# Patient Record
Sex: Female | Born: 1950
Health system: Southern US, Community
[De-identification: ages and names within clinical notes are randomized; demographics above are authoritative.]

## PROBLEM LIST (undated history)

## (undated) DIAGNOSIS — T7840XA Allergy, unspecified, initial encounter: Secondary | ICD-10-CM

## (undated) DIAGNOSIS — Z860100 Personal history of colon polyps, unspecified: Secondary | ICD-10-CM

## (undated) DIAGNOSIS — Z8619 Personal history of other infectious and parasitic diseases: Secondary | ICD-10-CM

## (undated) DIAGNOSIS — K219 Gastro-esophageal reflux disease without esophagitis: Secondary | ICD-10-CM

## (undated) DIAGNOSIS — I10 Essential (primary) hypertension: Secondary | ICD-10-CM

## (undated) DIAGNOSIS — R011 Cardiac murmur, unspecified: Secondary | ICD-10-CM

## (undated) HISTORY — PX: TUBAL LIGATION: SHX77

## (undated) HISTORY — DX: Personal history of other infectious and parasitic diseases: Z86.19

## (undated) HISTORY — DX: Allergy, unspecified, initial encounter: T78.40XA

## (undated) HISTORY — DX: Personal history of colon polyps, unspecified: Z86.0100

## (undated) HISTORY — DX: Personal history of colonic polyps: Z86.010

## (undated) HISTORY — DX: Gastro-esophageal reflux disease without esophagitis: K21.9

## (undated) HISTORY — DX: Cardiac murmur, unspecified: R01.1

## (undated) HISTORY — PX: BREAST BIOPSY: SHX20

---

## 2000-09-16 ENCOUNTER — Encounter: Admission: RE | Admit: 2000-09-16 | Discharge: 2000-09-16 | Payer: Self-pay | Admitting: Obstetrics and Gynecology

## 2000-09-16 ENCOUNTER — Other Ambulatory Visit: Admission: RE | Admit: 2000-09-16 | Discharge: 2000-09-16 | Payer: Self-pay | Admitting: Obstetrics and Gynecology

## 2000-09-16 ENCOUNTER — Encounter: Payer: Self-pay | Admitting: Obstetrics and Gynecology

## 2000-10-06 ENCOUNTER — Encounter: Admission: RE | Admit: 2000-10-06 | Discharge: 2000-10-06 | Payer: Self-pay | Admitting: Obstetrics and Gynecology

## 2000-10-06 ENCOUNTER — Encounter: Payer: Self-pay | Admitting: Obstetrics and Gynecology

## 2002-01-05 ENCOUNTER — Encounter: Admission: RE | Admit: 2002-01-05 | Discharge: 2002-01-05 | Payer: Self-pay | Admitting: Obstetrics and Gynecology

## 2002-01-05 ENCOUNTER — Encounter: Payer: Self-pay | Admitting: Obstetrics and Gynecology

## 2002-01-05 ENCOUNTER — Other Ambulatory Visit: Admission: RE | Admit: 2002-01-05 | Discharge: 2002-01-05 | Payer: Self-pay | Admitting: Obstetrics and Gynecology

## 2002-09-30 HISTORY — PX: BREAST BIOPSY: SHX20

## 2002-11-11 ENCOUNTER — Encounter: Admission: RE | Admit: 2002-11-11 | Discharge: 2002-11-11 | Payer: Self-pay | Admitting: *Deleted

## 2002-11-11 ENCOUNTER — Encounter: Payer: Self-pay | Admitting: *Deleted

## 2002-11-16 ENCOUNTER — Encounter: Admission: RE | Admit: 2002-11-16 | Discharge: 2002-11-16 | Payer: Self-pay | Admitting: *Deleted

## 2002-11-16 ENCOUNTER — Encounter: Payer: Self-pay | Admitting: *Deleted

## 2002-11-22 ENCOUNTER — Encounter: Admission: RE | Admit: 2002-11-22 | Discharge: 2002-11-22 | Payer: Self-pay | Admitting: *Deleted

## 2002-11-22 ENCOUNTER — Encounter: Payer: Self-pay | Admitting: *Deleted

## 2002-12-10 ENCOUNTER — Encounter (INDEPENDENT_AMBULATORY_CARE_PROVIDER_SITE_OTHER): Payer: Self-pay | Admitting: *Deleted

## 2002-12-10 ENCOUNTER — Ambulatory Visit (HOSPITAL_BASED_OUTPATIENT_CLINIC_OR_DEPARTMENT_OTHER): Admission: RE | Admit: 2002-12-10 | Discharge: 2002-12-10 | Payer: Self-pay | Admitting: *Deleted

## 2004-02-20 ENCOUNTER — Encounter: Admission: RE | Admit: 2004-02-20 | Discharge: 2004-02-20 | Payer: Self-pay | Admitting: Obstetrics and Gynecology

## 2005-03-25 ENCOUNTER — Encounter: Admission: RE | Admit: 2005-03-25 | Discharge: 2005-03-25 | Payer: Self-pay | Admitting: Obstetrics and Gynecology

## 2006-05-13 ENCOUNTER — Encounter: Admission: RE | Admit: 2006-05-13 | Discharge: 2006-05-13 | Payer: Self-pay | Admitting: Obstetrics and Gynecology

## 2006-05-23 ENCOUNTER — Encounter: Admission: RE | Admit: 2006-05-23 | Discharge: 2006-05-23 | Payer: Self-pay | Admitting: Obstetrics and Gynecology

## 2007-08-14 ENCOUNTER — Encounter: Admission: RE | Admit: 2007-08-14 | Discharge: 2007-08-14 | Payer: Self-pay | Admitting: Obstetrics and Gynecology

## 2009-10-02 ENCOUNTER — Encounter: Admission: RE | Admit: 2009-10-02 | Discharge: 2009-10-02 | Payer: Self-pay | Admitting: Obstetrics and Gynecology

## 2010-10-21 ENCOUNTER — Encounter: Payer: Self-pay | Admitting: Obstetrics and Gynecology

## 2011-02-15 NOTE — Op Note (Signed)
NAME:  Shawna Mata, Shawna Mata                          ACCOUNT NO.:  [id number]   MEDICAL RECORD NO.:  [id number]                   PATIENT TYPE:  AMB   LOCATION:  DSC                                  FACILITY:  [facility]   PHYSICIAN:  Maisie Fus B. Samuella Cota, M.D.               DATE OF BIRTH:  August 11, 1951   DATE OF PROCEDURE:  12/10/2002  DATE OF DISCHARGE:                                 OPERATIVE REPORT   CCS#:  [id number]   PREOPERATIVE DIAGNOSES:  Nipple discharge, left breast with abnormal  ductogram.   POSTOPERATIVE DIAGNOSES:  Nipple discharge, left breast with abnormal  ductogram.   OPERATION:  Excision of ductal tissue, left breast.   SURGEON:  Maisie Fus B. Samuella Cota, M.D.   ANESTHESIA:  Local, 1% Xylocaine with anesthesia monitoring, Dr. Katrinka Blazing and  CRNA.   DESCRIPTION OF PROCEDURE:  The patient was taken to the operating room,  placed on the table in supine position. The left breast was prepped and  draped in a sterile field. A circumareolar incision was outlined from about  10 o'clock to 2 o'clock. Local anesthetic was injected in the subcutaneous  tissue and into the breast tissue. The circumareolar incision was then made  and the tissue beneath the upper portion of the areolar was taken down. The  patient had some firmness at the 12 o'clock position. A large duct was seen  and a lacrimal probe placed in it and it went about 2 cm deep. There was  another large duct which was not quite that long. This core of breast tissue  was taken out going essentially from the nipple and areolar complex toward  the chest. No brownish discharge was noted but the drainage from the duct  was watery. The large duct opening was marked with a suture and the depth of  the wound was also marked with a long and short suture. The bleeding was  controlled with the cautery, subcutaneous tissue closed with interrupted  sutures of 3-0 Vicryl and the skin was closed with a running subcuticular  suture of 4-0  Monocryl.  Benzoin and 1/2 inch Steri-Strips were used to reinforce the skin  closure. A pressure dressing using 4 x 4s, ABD and 4 inch Hypofix was  applied. The patient seemed to tolerate the procedure well and was taken to  the PACU in satisfactory condition.                                               Thomas B. Samuella Cota, M.D.    TBP/MEDQ  D:  12/10/2002  T:  12/11/2002  Job:  [id number]   cc:   Malachi Pro. Ambrose Mantle, M.D.  [address]  [place]  [place] [address]  Fax: [phone]   Lenon Curt. Chilton Si, M.D.  [address]  [place]  [place] [address]  Fax: [phone]

## 2011-03-25 ENCOUNTER — Other Ambulatory Visit: Payer: Self-pay | Admitting: Obstetrics and Gynecology

## 2011-03-25 DIAGNOSIS — Z1231 Encounter for screening mammogram for malignant neoplasm of breast: Secondary | ICD-10-CM

## 2011-04-05 ENCOUNTER — Ambulatory Visit: Payer: Self-pay

## 2011-04-08 ENCOUNTER — Ambulatory Visit
Admission: RE | Admit: 2011-04-08 | Discharge: 2011-04-08 | Disposition: A | Discharge status: Home / Self Care | Payer: Managed Care, Other (non HMO) | Source: Ambulatory Visit | Attending: Obstetrics and Gynecology | Admitting: Obstetrics and Gynecology

## 2011-04-08 DIAGNOSIS — Z1231 Encounter for screening mammogram for malignant neoplasm of breast: Secondary | ICD-10-CM

## 2012-04-10 ENCOUNTER — Other Ambulatory Visit: Payer: Self-pay | Admitting: Obstetrics and Gynecology

## 2012-04-10 DIAGNOSIS — Z1231 Encounter for screening mammogram for malignant neoplasm of breast: Secondary | ICD-10-CM

## 2012-04-21 ENCOUNTER — Ambulatory Visit
Admission: RE | Admit: 2012-04-21 | Discharge: 2012-04-21 | Disposition: A | Discharge status: Home / Self Care | Payer: Managed Care, Other (non HMO) | Source: Ambulatory Visit | Attending: Obstetrics and Gynecology | Admitting: Obstetrics and Gynecology

## 2012-04-21 DIAGNOSIS — Z1231 Encounter for screening mammogram for malignant neoplasm of breast: Secondary | ICD-10-CM

## 2014-11-16 ENCOUNTER — Other Ambulatory Visit: Payer: Self-pay

## 2014-11-16 DIAGNOSIS — Z1231 Encounter for screening mammogram for malignant neoplasm of breast: Secondary | ICD-10-CM

## 2014-11-28 ENCOUNTER — Ambulatory Visit
Admission: RE | Admit: 2014-11-28 | Discharge: 2014-11-28 | Disposition: A | Discharge status: Home / Self Care | Payer: PRIVATE HEALTH INSURANCE | Source: Ambulatory Visit

## 2014-11-28 DIAGNOSIS — Z1231 Encounter for screening mammogram for malignant neoplasm of breast: Secondary | ICD-10-CM

## 2015-12-28 ENCOUNTER — Other Ambulatory Visit: Payer: Self-pay

## 2015-12-28 DIAGNOSIS — Z1231 Encounter for screening mammogram for malignant neoplasm of breast: Secondary | ICD-10-CM

## 2016-01-02 ENCOUNTER — Ambulatory Visit
Admission: RE | Admit: 2016-01-02 | Discharge: 2016-01-02 | Disposition: A | Discharge status: Home / Self Care | Payer: PRIVATE HEALTH INSURANCE | Source: Ambulatory Visit

## 2016-01-02 DIAGNOSIS — Z1231 Encounter for screening mammogram for malignant neoplasm of breast: Secondary | ICD-10-CM

## 2016-09-30 HISTORY — PX: ROTATOR CUFF REPAIR: SHX139

## 2017-01-03 ENCOUNTER — Other Ambulatory Visit: Payer: Self-pay | Admitting: Obstetrics and Gynecology

## 2017-01-03 DIAGNOSIS — Z1231 Encounter for screening mammogram for malignant neoplasm of breast: Secondary | ICD-10-CM

## 2017-01-22 ENCOUNTER — Ambulatory Visit
Admission: RE | Admit: 2017-01-22 | Discharge: 2017-01-22 | Disposition: A | Discharge status: Home / Self Care | Payer: PRIVATE HEALTH INSURANCE | Source: Ambulatory Visit | Attending: Obstetrics and Gynecology | Admitting: Obstetrics and Gynecology

## 2017-01-22 DIAGNOSIS — Z1231 Encounter for screening mammogram for malignant neoplasm of breast: Secondary | ICD-10-CM

## 2017-04-22 ENCOUNTER — Encounter (HOSPITAL_COMMUNITY): Payer: Self-pay | Admitting: Nurse Practitioner

## 2017-04-22 ENCOUNTER — Emergency Department (HOSPITAL_COMMUNITY): Payer: PPO

## 2017-04-22 DIAGNOSIS — M79601 Pain in right arm: Secondary | ICD-10-CM | POA: Diagnosis not present

## 2017-04-22 DIAGNOSIS — W19XXXA Unspecified fall, initial encounter: Secondary | ICD-10-CM | POA: Diagnosis not present

## 2017-04-22 DIAGNOSIS — M25511 Pain in right shoulder: Secondary | ICD-10-CM | POA: Diagnosis not present

## 2017-04-22 DIAGNOSIS — I10 Essential (primary) hypertension: Secondary | ICD-10-CM | POA: Insufficient documentation

## 2017-04-22 DIAGNOSIS — M79621 Pain in right upper arm: Secondary | ICD-10-CM | POA: Diagnosis not present

## 2017-04-22 MED ORDER — IBUPROFEN 400 MG PO TABS
400.0000 mg | ORAL_TABLET | Freq: Once | ORAL | Status: AC | PRN
  Administered 2017-04-22: 400 mg via ORAL

## 2017-04-22 MED ORDER — IBUPROFEN 400 MG PO TABS
ORAL_TABLET | ORAL | Status: AC
  Filled 2017-04-22: qty 1

## 2017-04-22 NOTE — ED Triage Notes (Signed)
Pt states while walking her shoe got caught and tripped and fell on right arm. Pt denies LOC. Endorses right elbow and arm pain. No abrasion, bruising, redness or obvious deformities noted. Pt sts unable to lift arm due to pain.

## 2017-04-23 ENCOUNTER — Emergency Department (HOSPITAL_COMMUNITY)
Admission: EM | Admit: 2017-04-23 | Discharge: 2017-04-23 | Disposition: A | Discharge status: Home / Self Care | Payer: PPO | Attending: Emergency Medicine | Admitting: Emergency Medicine

## 2017-04-23 DIAGNOSIS — M79601 Pain in right arm: Secondary | ICD-10-CM

## 2017-04-23 DIAGNOSIS — W19XXXA Unspecified fall, initial encounter: Secondary | ICD-10-CM

## 2017-04-23 HISTORY — DX: Essential (primary) hypertension: I10

## 2017-04-23 MED ORDER — HYDROCODONE-ACETAMINOPHEN 5-325 MG PO TABS
1.0000 | ORAL_TABLET | Freq: Once | ORAL | Status: AC
  Administered 2017-04-23: 1 via ORAL
  Filled 2017-04-23: qty 1

## 2017-04-23 MED ORDER — IBUPROFEN 600 MG PO TABS: 600.0000 mg | ORAL_TABLET | Freq: Four times a day (QID) | ORAL | 0 refills | Status: DC | PRN

## 2017-04-23 MED ORDER — HYDROCODONE-ACETAMINOPHEN 5-325 MG PO TABS: 1.0000 | ORAL_TABLET | ORAL | 0 refills | Status: DC | PRN

## 2017-04-23 NOTE — ED Provider Notes (Signed)
[facility] DEPT Provider Note   CSN: [id number] Arrival date & time: 04/22/17  2216     History   Chief Complaint No chief complaint on file.   HPI Shawna Mata is a 66 y.o. female.  Patient presents after a mechanical fall onto her right arm earlier this evening. She did not hit her head. She has been ambulatory since the fall without complaint of LE pain. No numbness of the RUE, but she reports increased pain when trying to abduct the arm. No chest/abdominal/neck pain.   The history is provided by the spouse and the patient.    Past Medical History:  Diagnosis Date  . Hypertension     There are no active problems to display for this patient.   Past Surgical History:  Procedure Laterality Date  . BREAST BIOPSY      OB History    No data available       Home Medications    Prior to Admission medications   Not on File    Family History No family history on file.  Social History Social History  Substance Use Topics  . Smoking status: Never Smoker  . Smokeless tobacco: Never Used  . Alcohol use Not on file     Allergies   Codeine   Review of Systems Review of Systems  Constitutional: Negative for chills and fever.  Respiratory: Negative.  Negative for shortness of breath.   Cardiovascular: Negative.  Negative for chest pain.  Gastrointestinal: Negative.  Negative for abdominal pain.  Musculoskeletal:       See HPI.  Skin: Negative.  Negative for wound.  Neurological: Negative.  Negative for numbness.     Physical Exam Updated Vital Signs BP 119/76 (BP Location: Left Arm)   Pulse 93   Temp 98.2 F (36.8 C) (Oral)   Resp 18   Ht 5\' 4"  (1.626 m)   Wt 90.7 kg (200 lb)   SpO2 96%   BMI 34.33 kg/m   Physical Exam  Constitutional: She is oriented to person, place, and time. She appears well-developed and well-nourished.  Neck: Normal range of motion.  Cardiovascular: Intact distal pulses.   Pulmonary/Chest: Effort normal. She  exhibits no tenderness.  Abdominal: There is no tenderness.  Musculoskeletal:  No midline cervical tenderness. RUE is unremarkable in appearance - no swelling, discoloration or deformity. Tender over lateral proximal upper arm. Pain with active and passive ROM.  Neurological: She is alert and oriented to person, place, and time.  Skin: Skin is warm and dry.     ED Treatments / Results  Labs (all labs ordered are listed, but only abnormal results are displayed) Labs Reviewed - No data to display  EKG  EKG Interpretation None       Radiology Dg Humerus Right  Result Date: 04/22/2017 CLINICAL DATA:  Persistent pain after falling onto right arm today. EXAM: RIGHT HUMERUS - 2+ VIEW COMPARISON:  None. FINDINGS: There is no evidence of fracture or other focal bone lesions. Soft tissues are unremarkable. IMPRESSION: Negative. Electronically Signed   By: Ellery Plunkaniel R Mitchell M.D.   On: 04/22/2017 23:51    Procedures Procedures (including critical care time)  Medications Ordered in ED Medications  ibuprofen (ADVIL,MOTRIN) 400 MG tablet (not administered)  HYDROcodone-acetaminophen (NORCO/VICODIN) 5-325 MG per tablet 1 tablet (not administered)  ibuprofen (ADVIL,MOTRIN) tablet 400 mg (400 mg Oral Given 04/22/17 2254)     Initial Impression / Assessment and Plan / ED Course  I have reviewed the triage  vital signs and the nursing notes.  Pertinent labs & imaging results that were available during my care of the patient were reviewed by me and considered in my medical decision making (see chart for details).     Patient presents with right UE pain after fall. She does not remember how she landed on the arm but has pain to proximal arm and difficulty with abduction due to pain.   Imaging is negative for fracture. Will treat supportively and refer to ortho if pain is no better in 3-4 days for recheck.   Final Clinical Impressions(s) / ED Diagnoses   Final diagnoses:  None   1.  Fall 2. RUE musculoskeletal pain  New Prescriptions New Prescriptions   No medications on file     Danne HarborUpstill, Tabitha Riggins, PA-C 04/23/17 0033    Dione BoozeGlick, David, MD 04/23/17 [phone]

## 2017-04-23 NOTE — ED Notes (Signed)
Pt verbalized understanding discharge instructions and denies any further needs or questions at this time. VS stable, ambulatory and steady gait.   

## 2017-04-25 DIAGNOSIS — S43491A Other sprain of right shoulder joint, initial encounter: Secondary | ICD-10-CM | POA: Diagnosis not present

## 2017-05-01 DIAGNOSIS — I10 Essential (primary) hypertension: Secondary | ICD-10-CM | POA: Diagnosis not present

## 2017-05-01 DIAGNOSIS — S43491A Other sprain of right shoulder joint, initial encounter: Secondary | ICD-10-CM | POA: Diagnosis not present

## 2017-05-01 DIAGNOSIS — M25511 Pain in right shoulder: Secondary | ICD-10-CM | POA: Diagnosis not present

## 2017-05-06 DIAGNOSIS — M25511 Pain in right shoulder: Secondary | ICD-10-CM | POA: Diagnosis not present

## 2017-05-06 DIAGNOSIS — S46011D Strain of muscle(s) and tendon(s) of the rotator cuff of right shoulder, subsequent encounter: Secondary | ICD-10-CM | POA: Diagnosis not present

## 2017-05-09 DIAGNOSIS — S46001A Unspecified injury of muscle(s) and tendon(s) of the rotator cuff of right shoulder, initial encounter: Secondary | ICD-10-CM | POA: Diagnosis not present

## 2017-05-09 DIAGNOSIS — R7303 Prediabetes: Secondary | ICD-10-CM | POA: Diagnosis not present

## 2017-05-09 DIAGNOSIS — Z01818 Encounter for other preprocedural examination: Secondary | ICD-10-CM | POA: Diagnosis not present

## 2017-05-09 DIAGNOSIS — I10 Essential (primary) hypertension: Secondary | ICD-10-CM | POA: Diagnosis not present

## 2017-05-26 ENCOUNTER — Encounter: Payer: Self-pay | Admitting: Internal Medicine

## 2017-05-26 ENCOUNTER — Ambulatory Visit (INDEPENDENT_AMBULATORY_CARE_PROVIDER_SITE_OTHER): Payer: PPO | Admitting: Internal Medicine

## 2017-05-26 VITALS — BP 130/78 | HR 85 | Ht 64.0 in | Wt 201.6 lb

## 2017-05-26 DIAGNOSIS — R011 Cardiac murmur, unspecified: Secondary | ICD-10-CM | POA: Diagnosis not present

## 2017-05-26 DIAGNOSIS — Z0000 Encounter for general adult medical examination without abnormal findings: Secondary | ICD-10-CM | POA: Insufficient documentation

## 2017-05-26 DIAGNOSIS — Z01810 Encounter for preprocedural cardiovascular examination: Secondary | ICD-10-CM

## 2017-05-26 NOTE — Patient Instructions (Addendum)
Your physician has requested that you have an echocardiogram @ [address] - 3rd Floor. Echocardiography is a painless test that uses sound waves to create images of your heart. It provides your doctor with information about the size and shape of your heart and how well your heart's chambers and valves are working. This procedure takes approximately one hour. There are no restrictions for this procedure.  Your physician recommends that you schedule a follow-up appointment as needed - we will contact you about your test results.

## 2017-05-26 NOTE — Progress Notes (Signed)
OFFICE CONSULT NOTE  Chief Complaint:  Murmur, preoperative clearance  Primary Care Physician: Farris Has, MD  HPI:  Shawna Mata is a 66 y.o. female who is being seen today for the evaluation of murmur and preoperative clearance at the request of Farris Has, MD. Shawna Mata was scheduled for surgery today of her right shoulder which was arthroscopic. She was prepped for surgery with Dr. Shelle Iron and the anesthesiologist noted she had a systolic murmur. The surgery was therefore canceled and she was sent to our office for preoperative risk assessment. She says she was never told she had a murmur in the past. She had evaluation by her primary care provider do not mention a murmur and had had EKG which was normal. We repeated EKG today which shows normal sinus rhythm and no ischemic changes. Denies any recent angina or worsening shortness of breath. Family history significant for coronary artery disease and bypass surgery in her father who died of MI and her brother who had coronary artery disease and has Parkinson's. She reports that she can walk up stairs without any limitations. Her only other past medical history was hypertension.  PMHx:  Past Medical History:  Diagnosis Date  . Hypertension     Past Surgical History:  Procedure Laterality Date  . BREAST BIOPSY    . TUBAL LIGATION  1980s    FAMHx:  Family History  Problem Relation Age of Onset  . Parkinson's disease Brother     SOCHx:   reports that she has never smoked. She has never used smokeless tobacco. She reports that she drinks about 0.6 oz of alcohol per week . Her drug history is not on file.  ALLERGIES:  Allergies  Allergen Reactions  . Codeine Other (See Comments)    headache    ROS: Pertinent items noted in HPI and remainder of comprehensive ROS otherwise negative.  HOME MEDS: Current Outpatient Prescriptions on File Prior to Visit  Medication Sig Dispense Refill  . ibuprofen (ADVIL,MOTRIN) 600 MG  tablet Take 1 tablet (600 mg total) by mouth every 6 (six) hours as needed. 30 tablet 0   No current facility-administered medications on file prior to visit.     LABS/IMAGING: No results found for this or any previous visit (from the past 48 hour(s)). No results found.  LIPID PANEL: No results found for: CHOL, TRIG, HDL, CHOLHDL, VLDL, LDLCALC, LDLDIRECT  WEIGHTS: Wt Readings from Last 3 Encounters:  05/26/17 201 lb 9.6 oz (91.4 kg)  04/22/17 200 lb (90.7 kg)    VITALS: BP 130/78   Pulse 85   Ht 5\' 4"  (1.626 m)   Wt 201 lb 9.6 oz (91.4 kg)   BMI 34.60 kg/m   EXAM: General appearance: alert and no distress Neck: no carotid bruit, no JVD and thyroid not enlarged, symmetric, no tenderness/mass/nodules Lungs: clear to auscultation bilaterally Heart: regular rate and rhythm, S1, S2 normal and systolic murmur: early systolic 2/6, crescendo at 2nd right intercostal space Abdomen: soft, non-tender; bowel sounds normal; no masses,  no organomegaly Extremities: extremities normal, atraumatic, no cyanosis or edema Pulses: 2+ and symmetric Skin: Skin color, texture, turgor normal. No rashes or lesions Neurologic: Grossly normal Psych: Pleasant  EKG: Sinus rhythm at 85 - personally reviewed  ASSESSMENT: 1. Low risk for upcoming surgery 2. Hypertension-controlled 3. Murmur  PLAN: 1.   Shawna Mata should be at low risk for upcoming surgery. She has a murmur but is active and can walk up a flight of stairs  without any mutations. She denies any anginal symptoms. Her murmur is soft and may be a flow murmur - I'm not suspicious this is aortic stenosis and I do not appreciate radiation to the neck base. Will obtain an echocardiogram as a baseline and likely clear her for her shoulder surgery. Additionally, I recommended risk factor modification given a family history of heart disease and continued work with her primary care provider on risk factor avoidance.  Follow-up with me as needed.  We'll contact her with results of her echo.  Chrystie Nose, MD, [facility]  [place]  [facility]  Attending Cardiologist  Direct Dial: [phone]  Fax: [phone]  Website:  [email or web address] [email or web address] Poser 05/26/2017, 3:52 PM

## 2017-05-27 ENCOUNTER — Other Ambulatory Visit: Payer: Self-pay

## 2017-05-27 ENCOUNTER — Ambulatory Visit (HOSPITAL_COMMUNITY): Payer: PPO | Attending: Cardiology

## 2017-05-27 ENCOUNTER — Telehealth: Payer: Self-pay

## 2017-05-27 DIAGNOSIS — R011 Cardiac murmur, unspecified: Secondary | ICD-10-CM | POA: Diagnosis not present

## 2017-05-27 DIAGNOSIS — Z01810 Encounter for preprocedural cardiovascular examination: Secondary | ICD-10-CM | POA: Insufficient documentation

## 2017-05-27 DIAGNOSIS — I10 Essential (primary) hypertension: Secondary | ICD-10-CM | POA: Insufficient documentation

## 2017-05-27 NOTE — Telephone Encounter (Signed)
Sent notes to scheduling 

## 2017-05-28 ENCOUNTER — Encounter: Payer: Self-pay | Admitting: Internal Medicine

## 2017-05-29 ENCOUNTER — Telehealth: Payer: Self-pay | Admitting: Internal Medicine

## 2017-05-29 ENCOUNTER — Encounter: Payer: Self-pay | Admitting: *Deleted

## 2017-05-29 NOTE — Telephone Encounter (Signed)
Patient was evaluated by Dr. Rennis GoldenHilty on 05/26/17 for pre-op clearance. She had pre-op echo on 05/27/17. MD has cleared patient for shoulder surgery with Dr. Shelle IronBeane. Faxed note of clearance ("no significant valvular abnormalities on echo, normal LV function, cleared for surgery from a cardiac standpoint") and copy of echo to [phone].

## 2017-06-06 DIAGNOSIS — S46011A Strain of muscle(s) and tendon(s) of the rotator cuff of right shoulder, initial encounter: Secondary | ICD-10-CM | POA: Diagnosis not present

## 2017-06-06 DIAGNOSIS — M7541 Impingement syndrome of right shoulder: Secondary | ICD-10-CM | POA: Diagnosis not present

## 2017-06-06 DIAGNOSIS — G8918 Other acute postprocedural pain: Secondary | ICD-10-CM | POA: Diagnosis not present

## 2017-06-06 DIAGNOSIS — W19XXXA Unspecified fall, initial encounter: Secondary | ICD-10-CM | POA: Diagnosis not present

## 2017-06-30 DIAGNOSIS — M25511 Pain in right shoulder: Secondary | ICD-10-CM | POA: Diagnosis not present

## 2017-07-03 DIAGNOSIS — M25511 Pain in right shoulder: Secondary | ICD-10-CM | POA: Diagnosis not present

## 2017-07-07 DIAGNOSIS — M25511 Pain in right shoulder: Secondary | ICD-10-CM | POA: Diagnosis not present

## 2017-07-09 DIAGNOSIS — M25511 Pain in right shoulder: Secondary | ICD-10-CM | POA: Diagnosis not present

## 2017-07-14 DIAGNOSIS — M25511 Pain in right shoulder: Secondary | ICD-10-CM | POA: Diagnosis not present

## 2017-07-16 DIAGNOSIS — I10 Essential (primary) hypertension: Secondary | ICD-10-CM | POA: Diagnosis not present

## 2017-07-16 DIAGNOSIS — M75101 Unspecified rotator cuff tear or rupture of right shoulder, not specified as traumatic: Secondary | ICD-10-CM | POA: Diagnosis not present

## 2017-07-16 DIAGNOSIS — M25511 Pain in right shoulder: Secondary | ICD-10-CM | POA: Diagnosis not present

## 2017-07-16 DIAGNOSIS — Z23 Encounter for immunization: Secondary | ICD-10-CM | POA: Diagnosis not present

## 2017-07-21 DIAGNOSIS — M25511 Pain in right shoulder: Secondary | ICD-10-CM | POA: Diagnosis not present

## 2017-07-28 DIAGNOSIS — M25511 Pain in right shoulder: Secondary | ICD-10-CM | POA: Diagnosis not present

## 2017-07-30 DIAGNOSIS — M170 Bilateral primary osteoarthritis of knee: Secondary | ICD-10-CM | POA: Diagnosis not present

## 2017-08-04 DIAGNOSIS — M25511 Pain in right shoulder: Secondary | ICD-10-CM | POA: Diagnosis not present

## 2017-08-06 DIAGNOSIS — M25511 Pain in right shoulder: Secondary | ICD-10-CM | POA: Diagnosis not present

## 2017-08-11 DIAGNOSIS — M25511 Pain in right shoulder: Secondary | ICD-10-CM | POA: Diagnosis not present

## 2017-08-14 DIAGNOSIS — M25511 Pain in right shoulder: Secondary | ICD-10-CM | POA: Diagnosis not present

## 2017-08-18 DIAGNOSIS — M25511 Pain in right shoulder: Secondary | ICD-10-CM | POA: Diagnosis not present

## 2017-08-20 DIAGNOSIS — M25511 Pain in right shoulder: Secondary | ICD-10-CM | POA: Diagnosis not present

## 2017-08-25 DIAGNOSIS — M25511 Pain in right shoulder: Secondary | ICD-10-CM | POA: Diagnosis not present

## 2017-08-27 DIAGNOSIS — M25511 Pain in right shoulder: Secondary | ICD-10-CM | POA: Diagnosis not present

## 2017-09-01 DIAGNOSIS — M25511 Pain in right shoulder: Secondary | ICD-10-CM | POA: Diagnosis not present

## 2017-09-04 DIAGNOSIS — M25511 Pain in right shoulder: Secondary | ICD-10-CM | POA: Diagnosis not present

## 2017-09-10 DIAGNOSIS — M25511 Pain in right shoulder: Secondary | ICD-10-CM | POA: Diagnosis not present

## 2017-09-10 DIAGNOSIS — Z4789 Encounter for other orthopedic aftercare: Secondary | ICD-10-CM | POA: Diagnosis not present

## 2017-09-15 DIAGNOSIS — M25511 Pain in right shoulder: Secondary | ICD-10-CM | POA: Diagnosis not present

## 2017-09-17 DIAGNOSIS — M25511 Pain in right shoulder: Secondary | ICD-10-CM | POA: Diagnosis not present

## 2017-09-26 DIAGNOSIS — M25511 Pain in right shoulder: Secondary | ICD-10-CM | POA: Diagnosis not present

## 2017-10-07 DIAGNOSIS — M25519 Pain in unspecified shoulder: Secondary | ICD-10-CM | POA: Diagnosis not present

## 2017-10-22 DIAGNOSIS — M25511 Pain in right shoulder: Secondary | ICD-10-CM | POA: Diagnosis not present

## 2018-01-19 DIAGNOSIS — E559 Vitamin D deficiency, unspecified: Secondary | ICD-10-CM | POA: Diagnosis not present

## 2018-01-19 DIAGNOSIS — R7303 Prediabetes: Secondary | ICD-10-CM | POA: Diagnosis not present

## 2018-01-19 DIAGNOSIS — H6123 Impacted cerumen, bilateral: Secondary | ICD-10-CM | POA: Diagnosis not present

## 2018-01-19 DIAGNOSIS — E785 Hyperlipidemia, unspecified: Secondary | ICD-10-CM | POA: Diagnosis not present

## 2018-01-19 DIAGNOSIS — I10 Essential (primary) hypertension: Secondary | ICD-10-CM | POA: Diagnosis not present

## 2018-02-26 DIAGNOSIS — H5213 Myopia, bilateral: Secondary | ICD-10-CM | POA: Diagnosis not present

## 2018-02-26 DIAGNOSIS — H524 Presbyopia: Secondary | ICD-10-CM | POA: Diagnosis not present

## 2018-02-26 DIAGNOSIS — H1852 Epithelial (juvenile) corneal dystrophy: Secondary | ICD-10-CM | POA: Diagnosis not present

## 2018-07-21 DIAGNOSIS — M7989 Other specified soft tissue disorders: Secondary | ICD-10-CM | POA: Diagnosis not present

## 2018-07-21 DIAGNOSIS — I10 Essential (primary) hypertension: Secondary | ICD-10-CM | POA: Diagnosis not present

## 2018-07-21 DIAGNOSIS — Z23 Encounter for immunization: Secondary | ICD-10-CM | POA: Diagnosis not present

## 2018-07-21 DIAGNOSIS — E785 Hyperlipidemia, unspecified: Secondary | ICD-10-CM | POA: Diagnosis not present

## 2018-07-21 DIAGNOSIS — E559 Vitamin D deficiency, unspecified: Secondary | ICD-10-CM | POA: Diagnosis not present

## 2018-07-21 DIAGNOSIS — R7309 Other abnormal glucose: Secondary | ICD-10-CM | POA: Diagnosis not present

## 2018-07-21 LAB — BASIC METABOLIC PANEL
BUN: 22 — AB (ref 4–21)
Creatinine: 0.7 (ref 0.5–1.1)
Glucose: 98
Potassium: 3.7 (ref 3.4–5.3)
Sodium: 142 (ref 137–147)

## 2018-07-21 LAB — HEPATIC FUNCTION PANEL
ALT: 22 (ref 7–35)
AST: 16 (ref 13–35)
Alkaline Phosphatase: 78 (ref 25–125)
Bilirubin, Total: 0.7

## 2018-07-21 LAB — LIPID PANEL
Cholesterol: 205 — AB (ref 0–200)
HDL: 56 (ref 35–70)
LDL Cholesterol: 117
LDl/HDL Ratio: 3.6
Triglycerides: 158 (ref 40–160)

## 2018-07-21 LAB — VITAMIN D 25 HYDROXY (VIT D DEFICIENCY, FRACTURES): Vit D, 25-Hydroxy: 69.1

## 2018-07-21 LAB — HEMOGLOBIN A1C: Hemoglobin A1C: 6

## 2019-03-17 ENCOUNTER — Ambulatory Visit (INDEPENDENT_AMBULATORY_CARE_PROVIDER_SITE_OTHER): Payer: PPO | Admitting: Family Medicine

## 2019-03-17 ENCOUNTER — Encounter: Payer: Self-pay | Admitting: Family Medicine

## 2019-03-17 ENCOUNTER — Other Ambulatory Visit: Payer: Self-pay

## 2019-03-17 VITALS — BP 112/76 | HR 90 | Temp 98.4°F | Ht 64.25 in | Wt 199.2 lb

## 2019-03-17 DIAGNOSIS — E661 Drug-induced obesity: Secondary | ICD-10-CM | POA: Insufficient documentation

## 2019-03-17 DIAGNOSIS — Z6833 Body mass index (BMI) 33.0-33.9, adult: Secondary | ICD-10-CM

## 2019-03-17 DIAGNOSIS — E6609 Other obesity due to excess calories: Secondary | ICD-10-CM

## 2019-03-17 DIAGNOSIS — R7303 Prediabetes: Secondary | ICD-10-CM | POA: Diagnosis not present

## 2019-03-17 DIAGNOSIS — I10 Essential (primary) hypertension: Secondary | ICD-10-CM | POA: Insufficient documentation

## 2019-03-17 DIAGNOSIS — E66811 Obesity, class 1: Secondary | ICD-10-CM | POA: Insufficient documentation

## 2019-03-17 LAB — CBC WITH DIFFERENTIAL/PLATELET
Basophils Absolute: 0 10*3/uL (ref 0.0–0.1)
Basophils Relative: 0.5 % (ref 0.0–3.0)
Eosinophils Absolute: 0.1 10*3/uL (ref 0.0–0.7)
Eosinophils Relative: 1.8 % (ref 0.0–5.0)
HCT: 42.5 % (ref 36.0–46.0)
Hemoglobin: 14.5 g/dL (ref 12.0–15.0)
Lymphocytes Relative: 25.6 % (ref 12.0–46.0)
Lymphs Abs: 1.7 10*3/uL (ref 0.7–4.0)
MCHC: 34.1 g/dL (ref 30.0–36.0)
MCV: 87 fl (ref 78.0–100.0)
Monocytes Absolute: 0.4 10*3/uL (ref 0.1–1.0)
Monocytes Relative: 6 % (ref 3.0–12.0)
Neutro Abs: 4.5 10*3/uL (ref 1.4–7.7)
Neutrophils Relative %: 66.1 % (ref 43.0–77.0)
Platelets: 294 10*3/uL (ref 150.0–400.0)
RBC: 4.88 Mil/uL (ref 3.87–5.11)
RDW: 13.9 % (ref 11.5–15.5)
WBC: 6.8 10*3/uL (ref 4.0–10.5)

## 2019-03-17 LAB — BASIC METABOLIC PANEL
BUN: 18 mg/dL (ref 6–23)
CO2: 33 mEq/L — ABNORMAL HIGH (ref 19–32)
Calcium: 9.8 mg/dL (ref 8.4–10.5)
Chloride: 103 mEq/L (ref 96–112)
Creatinine, Ser: 0.73 mg/dL (ref 0.40–1.20)
GFR: 79.5 mL/min (ref >60.00)
Glucose, Bld: 99 mg/dL (ref 70–99)
Potassium: 4.3 mEq/L (ref 3.5–5.1)
Sodium: 142 mEq/L (ref 135–145)

## 2019-03-17 LAB — LIPID PANEL
Cholesterol: 196 mg/dL (ref 0–200)
HDL: 62.4 mg/dL (ref >39.00)
LDL Cholesterol: 110 mg/dL — ABNORMAL HIGH (ref 0–99)
NonHDL: 133.7
Total CHOL/HDL Ratio: 3
Triglycerides: 121 mg/dL (ref 0.0–149.0)
VLDL: 24.2 mg/dL (ref 0.0–40.0)

## 2019-03-17 LAB — HEMOGLOBIN A1C: Hgb A1c MFr Bld: 6.1 % (ref 4.6–6.5)

## 2019-03-17 MED ORDER — AMLODIPINE BESYLATE 5 MG PO TABS: 5.0000 mg | ORAL_TABLET | Freq: Every day | ORAL | 3 refills | Status: DC

## 2019-03-17 MED ORDER — HYDROCHLOROTHIAZIDE 25 MG PO TABS: 25.0000 mg | ORAL_TABLET | Freq: Every day | ORAL | 3 refills | Status: DC

## 2019-03-17 NOTE — Patient Instructions (Addendum)
Please call and schedule an appointment for screening mammogram. A referral is not needed.  [place]  Please schedule an appointment for a complete physical for 6 months

## 2019-03-17 NOTE — Progress Notes (Signed)
Subjective:    Patient ID: Shawna Mata, female    DOB: [birth year], 68 y.o.   MRN: [id number]  HPI This is a 68 yo female who presents today to establish care. Previously saw Dr. Orland Mustard at [place]. Lives with husband, "AB." [employer] member. Enjoys fishing, going to [place] [employer] where they have a second home near [place]. Doing ok with stress right now.   Last CPE- a couple of years ago Mammo- 01/22/2017 Pap- not indicated Colonoscopy- [facility], ? year Tdap- 2013 Flu- annual Eye- regular Dental- regular Exercise- not formal  Right knee pain- has had an injection. Worse with prolonged standing. Takes Alleve as needed occasionally with good relief.    Past Medical History:  Diagnosis Date  . Allergy   . GERD (gastroesophageal reflux disease)   . Heart murmur   . History of chickenpox   . Hypertension   . Personal history of colonic polyps    Past Surgical History:  Procedure Laterality Date  . BREAST BIOPSY  2004   polyp in the milk gland removed  . ROTATOR CUFF REPAIR Right 2018  . TUBAL LIGATION  1980s   Family History  Problem Relation Age of Onset  . Parkinson's disease Brother   . CAD Brother   . Heart attack Father        CABG   Social History   Tobacco Use  . Smoking status: Never Smoker  . Smokeless tobacco: Never Used  Substance Use Topics  . Alcohol use: Yes    Alcohol/week: 1.0 standard drinks    Types: 1 Glasses of wine per week    Comment: 2 times a month  . Drug use: Never      Review of Systems She denies chest pain, SOB, cough, abdominal pain, diarrhea/constipation, dysuria, frequency.     Objective:   Physical Exam Physical Exam  Vitals reviewed. Constitutional: Oriented to person, place, and time. Appears well-developed and well-nourished. Obese.  HENT:  Head: Normocephalic and atraumatic.  Eyes: Conjunctivae are normal.  Neck: Normal range of motion. Neck supple.  Cardiovascular: Normal rate.   Pulmonary/Chest: Effort normal.   Musculoskeletal: Normal range of motion.  Neurological: Alert and oriented to person, place, and time.   Psychiatric: Normal mood and affect. Behavior is normal. Judgment and thought content normal.         BP 112/76   Pulse 90   Temp 98.4 F (36.9 C)   Ht 5' 4.25" (1.632 m)   Wt 199 lb 4 oz (90.4 kg)   SpO2 97%   BMI 33.94 kg/m  Depression screen [facility] 2/9 03/17/2019  Decreased Interest 0  Down, Depressed, Hopeless 1  PHQ - 2 Score 1  Altered sleeping 1  Tired, decreased energy 1  Change in appetite 0  Feeling bad or failure about yourself  0  Trouble concentrating 0  Moving slowly or fidgety/restless 0  Suicidal thoughts 0  PHQ-9 Score 3    Assessment & Plan:  1. Essential hypertension - BP WNL in office today, no side effects of medication, continue current meds - hydrochlorothiazide (HYDRODIURIL) 25 MG tablet; Take 1 tablet (25 mg total) by mouth daily.  Dispense: 90 tablet; Refill: 3 - amLODipine (NORVASC) 5 MG tablet; Take 1 tablet (5 mg total) by mouth daily.  Dispense: 90 tablet; Refill: 3 - Basic Metabolic Panel - CBC with Differential - Lipid Panel  2. Prediabetes - Basic Metabolic Panel - Hemoglobin A1c - Lipid Panel  3. Class 1 obesity due  to excess calories without serious comorbidity with body mass index (BMI) of 33.0 to 33.9 in adult - Basic Metabolic Panel - Hemoglobin A1c - Lipid Panel  - she was provided with number to schedule mammogram - follow up in 6 months for CPE  Olean Reeeborah Pallavi Clifton, FNP-BC  [facility], [facility]  03/17/2019 10:14 AM

## 2019-10-19 ENCOUNTER — Ambulatory Visit: Payer: PPO | Attending: Internal Medicine

## 2019-10-19 DIAGNOSIS — Z23 Encounter for immunization: Secondary | ICD-10-CM

## 2019-10-19 NOTE — Progress Notes (Signed)
   Covid-19 Vaccination Clinic  Name:  Shawna Mata    MRN: [id number] DOB: 08/19/1951  10/19/2019  Shawna Mata was observed post Covid-19 immunization for 15 minutes without incidence. She was provided with Vaccine Information Sheet and instruction to access the V-Safe system.   Shawna Mata was instructed to call 911 with any severe reactions post vaccine: Marland Kitchen Difficulty breathing  . Swelling of your face and throat  . A fast heartbeat  . A bad rash all over your body  . Dizziness and weakness    Immunizations Administered    Name Date Dose VIS Date Route   Pfizer COVID-19 Vaccine 10/19/2019  5:12 PM 0.3 mL 09/10/2019 Intramuscular   Manufacturer: [employer], [employer]   Lot: [id number]   NDC: [address]-7

## 2019-11-06 ENCOUNTER — Ambulatory Visit: Payer: PPO | Attending: Internal Medicine

## 2019-11-06 DIAGNOSIS — Z23 Encounter for immunization: Secondary | ICD-10-CM | POA: Insufficient documentation

## 2019-11-06 NOTE — Progress Notes (Signed)
   Covid-19 Vaccination Clinic  Name:  Shawna Mata    MRN: [id number] DOB: Nov 14, 1950  11/06/2019  Shawna Mata was observed post Covid-19 immunization for 15 minutes without incidence. She was provided with Vaccine Information Sheet and instruction to access the V-Safe system.   Shawna Mata was instructed to call 911 with any severe reactions post vaccine: Marland Kitchen Difficulty breathing  . Swelling of your face and throat  . A fast heartbeat  . A bad rash all over your body  . Dizziness and weakness    Immunizations Administered    Name Date Dose VIS Date Route   Pfizer COVID-19 Vaccine 11/06/2019 10:56 AM 0.3 mL 09/10/2019 Intramuscular   Manufacturer: [employer], [employer]   Lot: [id number]   NDC: [address]-1

## 2019-11-09 ENCOUNTER — Ambulatory Visit: Payer: PPO

## 2020-03-05 ENCOUNTER — Other Ambulatory Visit: Payer: Self-pay | Admitting: Family Medicine

## 2020-03-05 DIAGNOSIS — I10 Essential (primary) hypertension: Secondary | ICD-10-CM

## 2020-04-03 ENCOUNTER — Other Ambulatory Visit: Payer: Self-pay | Admitting: Family Medicine

## 2020-04-03 DIAGNOSIS — I10 Essential (primary) hypertension: Secondary | ICD-10-CM

## 2020-04-06 ENCOUNTER — Other Ambulatory Visit: Payer: Self-pay | Admitting: Family Medicine

## 2020-04-06 DIAGNOSIS — I10 Essential (primary) hypertension: Secondary | ICD-10-CM

## 2020-06-06 ENCOUNTER — Other Ambulatory Visit: Payer: Self-pay | Admitting: Family Medicine

## 2020-06-06 DIAGNOSIS — I10 Essential (primary) hypertension: Secondary | ICD-10-CM

## 2020-06-07 NOTE — Telephone Encounter (Signed)
Please call pt to schedule an appt with Deboraha Sprang FNP.  Needs appt before refill of medication

## 2020-06-09 NOTE — Telephone Encounter (Signed)
Patient has scheduled her appt for CPE, but 1st avail is not until 07/28/2020. Can you refill her amlodipine and hydrochlorothiazide until her visit? Thank you!

## 2020-07-16 ENCOUNTER — Other Ambulatory Visit: Payer: Self-pay | Admitting: Family Medicine

## 2020-07-16 DIAGNOSIS — E6609 Other obesity due to excess calories: Secondary | ICD-10-CM

## 2020-07-16 DIAGNOSIS — Z1159 Encounter for screening for other viral diseases: Secondary | ICD-10-CM

## 2020-07-16 DIAGNOSIS — I10 Essential (primary) hypertension: Secondary | ICD-10-CM

## 2020-07-16 DIAGNOSIS — R7303 Prediabetes: Secondary | ICD-10-CM

## 2020-07-19 ENCOUNTER — Ambulatory Visit: Payer: PPO

## 2020-07-20 ENCOUNTER — Other Ambulatory Visit (INDEPENDENT_AMBULATORY_CARE_PROVIDER_SITE_OTHER): Payer: PPO

## 2020-07-20 ENCOUNTER — Other Ambulatory Visit: Payer: Self-pay

## 2020-07-20 DIAGNOSIS — Z1159 Encounter for screening for other viral diseases: Secondary | ICD-10-CM | POA: Diagnosis not present

## 2020-07-20 DIAGNOSIS — Z6833 Body mass index (BMI) 33.0-33.9, adult: Secondary | ICD-10-CM | POA: Diagnosis not present

## 2020-07-20 DIAGNOSIS — E6609 Other obesity due to excess calories: Secondary | ICD-10-CM

## 2020-07-20 DIAGNOSIS — R7303 Prediabetes: Secondary | ICD-10-CM

## 2020-07-20 DIAGNOSIS — I10 Essential (primary) hypertension: Secondary | ICD-10-CM | POA: Diagnosis not present

## 2020-07-20 LAB — LIPID PANEL
Cholesterol: 199 mg/dL (ref 0–200)
HDL: 55.2 mg/dL (ref >39.00)
LDL Cholesterol: 112 mg/dL — ABNORMAL HIGH (ref 0–99)
NonHDL: 143.3
Total CHOL/HDL Ratio: 4
Triglycerides: 156 mg/dL — ABNORMAL HIGH (ref 0.0–149.0)
VLDL: 31.2 mg/dL (ref 0.0–40.0)

## 2020-07-20 LAB — HEMOGLOBIN A1C: Hgb A1c MFr Bld: 6.2 % (ref 4.6–6.5)

## 2020-07-21 LAB — COMPREHENSIVE METABOLIC PANEL
ALT: 34 U/L (ref 0–35)
AST: 25 U/L (ref 0–37)
Albumin: 4.1 g/dL (ref 3.5–5.2)
Alkaline Phosphatase: 78 U/L (ref 39–117)
BUN: 15 mg/dL (ref 6–23)
CO2: 29 mEq/L (ref 19–32)
Calcium: 9.2 mg/dL (ref 8.4–10.5)
Chloride: 104 mEq/L (ref 96–112)
Creatinine, Ser: 0.75 mg/dL (ref 0.40–1.20)
GFR: 87 mL/min (ref >60.00)
Glucose, Bld: 103 mg/dL — ABNORMAL HIGH (ref 70–99)
Potassium: 3.6 mEq/L (ref 3.5–5.1)
Sodium: 142 mEq/L (ref 135–145)
Total Bilirubin: 0.6 mg/dL (ref 0.2–1.2)
Total Protein: 6.9 g/dL (ref 6.0–8.3)

## 2020-07-24 LAB — HEPATITIS C ANTIBODY
Hepatitis C Ab: NONREACTIVE
SIGNAL TO CUT-OFF: 0.01 (ref <1.00)

## 2020-07-28 ENCOUNTER — Encounter: Payer: Self-pay | Admitting: Family Medicine

## 2020-07-28 ENCOUNTER — Other Ambulatory Visit: Payer: Self-pay

## 2020-07-28 ENCOUNTER — Ambulatory Visit (INDEPENDENT_AMBULATORY_CARE_PROVIDER_SITE_OTHER): Payer: PPO | Admitting: Family Medicine

## 2020-07-28 VITALS — BP 126/82 | HR 88 | Temp 97.8°F | Ht 63.0 in | Wt 208.0 lb

## 2020-07-28 DIAGNOSIS — R7303 Prediabetes: Secondary | ICD-10-CM | POA: Diagnosis not present

## 2020-07-28 DIAGNOSIS — Z23 Encounter for immunization: Secondary | ICD-10-CM

## 2020-07-28 DIAGNOSIS — I10 Essential (primary) hypertension: Secondary | ICD-10-CM | POA: Diagnosis not present

## 2020-07-28 DIAGNOSIS — Z6836 Body mass index (BMI) 36.0-36.9, adult: Secondary | ICD-10-CM

## 2020-07-28 DIAGNOSIS — Z0000 Encounter for general adult medical examination without abnormal findings: Secondary | ICD-10-CM | POA: Diagnosis not present

## 2020-07-28 DIAGNOSIS — E7800 Pure hypercholesterolemia, unspecified: Secondary | ICD-10-CM | POA: Diagnosis not present

## 2020-07-28 DIAGNOSIS — E2839 Other primary ovarian failure: Secondary | ICD-10-CM | POA: Insufficient documentation

## 2020-07-28 DIAGNOSIS — E785 Hyperlipidemia, unspecified: Secondary | ICD-10-CM | POA: Insufficient documentation

## 2020-07-28 MED ORDER — AMLODIPINE BESYLATE 5 MG PO TABS: 5.0000 mg | ORAL_TABLET | Freq: Every day | ORAL | 3 refills | Status: DC

## 2020-07-28 MED ORDER — ATORVASTATIN CALCIUM 20 MG PO TABS: 20.0000 mg | ORAL_TABLET | Freq: Every day | ORAL | 3 refills | Status: DC

## 2020-07-28 MED ORDER — HYDROCHLOROTHIAZIDE 25 MG PO TABS: 25.0000 mg | ORAL_TABLET | Freq: Every day | ORAL | 3 refills | Status: DC

## 2020-07-28 NOTE — Progress Notes (Signed)
Subjective:    Patient ID: Shawna Mata, female    DOB: [birth year], 69 y.o.   MRN: [id number]  HPI Chief Complaint  Patient presents with  . Annual Exam   This is a 69 yo female who presents today for annual exam. 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 Her mother passed away last summer.    Last CPE- unsure Mammo- overdue, is interested in scheduling Pap- aged out Colonoscopy- 02/07/2014 Tdap- 05/18/2012 Flu- annual Covid 19 vaccine- fully vaccinated Eye- over due Dental- has had visit this year Exercise- not regular  The 10-year ASCVD risk score Denman George DC Jr., et al., 2013) is: 9.9%   Values used to calculate the score:     Age: 11 years     Sex: Female     Is Non-Hispanic African American: No     Diabetic: No     Tobacco smoker: No     Systolic Blood Pressure: 126 mmHg     Is BP treated: Yes     HDL Cholesterol: 55.2 mg/dL     Total Cholesterol: 199 mg/dL     Review of Systems  Constitutional: Negative.   HENT: Negative.   Eyes: Negative.   Respiratory: Negative.   Cardiovascular: Negative.   Gastrointestinal: Negative.   Endocrine: Negative.   Genitourinary: Negative.   Musculoskeletal:       Occasional left knee pain.   Skin: Negative.   Allergic/Immunologic: Positive for environmental allergies (good relief with Claritin).  Neurological: Negative.   Hematological: Negative.   Psychiatric/Behavioral: Negative.        Objective:   Physical Exam Physical Exam  Constitutional: She is oriented to person, place, and time. She appears well-developed and well-nourished. No distress.  HENT:  Head: Normocephalic and atraumatic.  Right Ear: External ear normal. TM normal.  Left Ear: External ear normal. TM normal.  Nose: Nose normal.  Mouth/Throat: Oropharynx is clear and moist. No oropharyngeal exudate.  Eyes: Conjunctivae are normal.   Neck: Normal range of motion. Neck supple. No JVD present. No thyromegaly  present.  Cardiovascular: Normal rate, regular rhythm, normal heart sounds and intact distal pulses.   Pulmonary/Chest: Effort normal and breath sounds normal. Right breast exhibits no inverted nipple, no mass, no nipple discharge, no skin change and no tenderness. Left breast exhibits no inverted nipple, no mass, no nipple discharge, no skin change and no tenderness. Breasts are symmetrical.  Abdominal: Soft. Bowel sounds are normal. She exhibits no distension and no mass. There is no tenderness. There is no rebound and no guarding.  Musculoskeletal: Normal range of motion. She exhibits no edema or tenderness.  Lymphadenopathy:    She has no cervical adenopathy.  Neurological: She is alert and oriented to person, place, and time.   Skin: Skin is warm and dry. She is not diaphoretic.  Psychiatric: She has a normal mood and affect. Her behavior is normal. Judgment and thought content normal.  Vitals reviewed.     BP 126/82   Pulse 88   Temp 97.8 F (36.6 C) (Temporal)   Ht 5\' 3"  (1.6 m)   Wt 208 lb (94.3 kg)   SpO2 98%   BMI 36.85 kg/m  Wt Readings from Last 3 Encounters:  07/28/20 208 lb (94.3 kg)  03/17/19 199 lb 4 oz (90.4 kg)  05/26/17 201 lb 9.6 oz (91.4 kg)   Depression screen [facility] 2/9 07/28/2020 03/17/2019  Decreased Interest 0 0  Down, Depressed, Hopeless - 1  PHQ - 2 Score 0 1  Altered sleeping - 1  Tired, decreased energy - 1  Change in appetite - 0  Feeling bad or failure about yourself  - 0  Trouble concentrating - 0  Moving slowly or fidgety/restless - 0  Suicidal thoughts - 0  PHQ-9 Score - 3       Assessment & Plan:  1. Annual physical exam - reviewed health maintenance, encouraged her to get Shingles vaccine at her pharmacy - call and schedule mammogram/ bone density  2. Estrogen deficiency - DG Bone Density; Future  3. Need for 23-polyvalent pneumococcal polysaccharide vaccine - Pneumococcal polysaccharide vaccine 23-valent greater than or equal to  2yo subcutaneous/IM  4. Elevated LDL cholesterol level - discussed ascvd risk score, advised to start low dose statin, discussed reducing sugars, starches, increase exercise - recheck in 3 months - atorvastatin (LIPITOR) 20 MG tablet; Take 1 tablet (20 mg total) by mouth daily.  Dispense: 90 tablet; Refill: 3 - Lipid panel; Future  5. Essential hypertension - well controlled, continue meds per below - hydrochlorothiazide (HYDRODIURIL) 25 MG tablet; Take 1 tablet (25 mg total) by mouth daily.  Dispense: 90 tablet; Refill: 3 - amLODipine (NORVASC) 5 MG tablet; Take 1 tablet (5 mg total) by mouth daily.  Dispense: 90 tablet; Refill: 3  6. Prediabetes - provided written and verbal information regarding diet and exercise  7. Class 2 severe obesity due to excess calories with serious comorbidity and body mass index (BMI) of 36.0 to 36.9 in adult [facility]) - per #6  This visit occurred during the SARS-CoV-2 public health emergency.  Safety protocols were in place, including screening questions prior to the visit, additional usage of staff PPE, and extensive cleaning of exam room while observing appropriate contact time as indicated for disinfecting solutions.      Olean Ree, FNP-BC  [facility], [facility]  07/28/2020 4:10 PM

## 2020-07-28 NOTE — Patient Instructions (Addendum)
Please call and schedule an appointment for screening mammogram and bone density test. A referral is not needed.  [phone]  I have sent in a medication for your cholesterol, atorvastatin, please schedule a lab only visit for 3 months- fast for at least 8 hours  There is not one right eating plan for everyone.  It may take trial and error to find what will work for you.  It is important to get adequate protein and fiber with your meals.  It is okay to not eat breakfast or to skip meals if you are not hungry.  Avoid snacking between meals.  Unless you are on a fluid restriction, drink 80 to 90 ounces of water a day.  Suggested resources- www.dietdoctor.com/diabetes/diet www.adaptyourlifeacademy.com-there is a quiz to help you determine how many carbohydrates you should eat a day  www.thefastingmethod.com  Here are some guidelines to help you with meal planning -  Avoid all processed and packaged foods (bread, pasta, crackers, chips, etc) and beverages containing calories.  Avoid added sugars and excessive natural sugars.  Pay attention to how you feel if you consume artificial sweeteners.  Do they make you more hungry or raise your blood sugar?  With every meal and snack, aim to get 20 g of protein (3 ounces of meat, 4 ounces of fish, 3 eggs, protein powder, 1 cup [place] yogurt, 1 cup cottage cheese, etc.)  Increase fiber in the form of non-starchy vegetables.  These help you feel full with very little carbohydrates and are good for gut health.  Nonstarchy vegetables include summer squash, onions, peppers, tomatoes, eggplant, broccoli, cauliflower, cabbage, lettuce, spinach.  Have small amounts of good fats such as avocado, nuts, olive oil, nut butters, olives.  Add a little cheese to your meals to make them tasty.   Try to plan your meals for the week and do some meal preparation when able.  If possible, make lunches for the week ahead of time.  Plan a  couple of dinners and make enough so you can have leftovers.  Build in a treat once a week.

## 2020-08-22 ENCOUNTER — Other Ambulatory Visit: Payer: Self-pay

## 2020-08-22 ENCOUNTER — Ambulatory Visit (INDEPENDENT_AMBULATORY_CARE_PROVIDER_SITE_OTHER): Payer: PPO

## 2020-08-22 DIAGNOSIS — Z0000 Encounter for general adult medical examination without abnormal findings: Secondary | ICD-10-CM

## 2020-08-22 NOTE — Patient Instructions (Signed)
Shawna Mata , Thank you for taking time to come for your Medicare Wellness Visit. I appreciate your ongoing commitment to your health goals. Please review the following plan we discussed and let me know if I can assist you in the future.   Screening recommendations/referrals: Colonoscopy: Up to date, completed 02/07/2014, due 01/2024 Mammogram: due, Please call and schedule appointment Bone Density: ordered 07/28/2020, Please call and schedule appointment Recommended yearly ophthalmology/optometry visit for glaucoma screening and checkup Recommended yearly dental visit for hygiene and checkup  Vaccinations: Influenza vaccine: Up to date, completed 07/16/2020, due 04/2021 Pneumococcal vaccine: Completed series Tdap vaccine: Up to date, completed 05/18/2012, due 04/2022 Shingles vaccine: due, check with your insurance regarding coverage/cost if interested    Covid-19:Completed series  Advanced directives: Advance directive discussed with you today. Even though you declined this today please call our office should you change your mind and we can give you the proper paperwork for you to fill out.  Conditions/risks identified: hypertension, hyperlipidemia  Next appointment: Follow up in one year for your annual wellness visit    Preventive Care 65 Years and Older, Female Preventive care refers to lifestyle choices and visits with your health care provider that can promote health and wellness. What does preventive care include?  A yearly physical exam. This is also called an annual well check.  Dental exams once or twice a year.  Routine eye exams. Ask your health care provider how often you should have your eyes checked.  Personal lifestyle choices, including:  Daily care of your teeth and gums.  Regular physical activity.  Eating a healthy diet.  Avoiding tobacco and drug use.  Limiting alcohol use.  Practicing safe sex.  Taking low-dose aspirin every day.  Taking vitamin and  mineral supplements as recommended by your health care provider. What happens during an annual well check? The services and screenings done by your health care provider during your annual well check will depend on your age, overall health, lifestyle risk factors, and family history of disease. Counseling  Your health care provider may ask you questions about your:  Alcohol use.  Tobacco use.  Drug use.  Emotional well-being.  Home and relationship well-being.  Sexual activity.  Eating habits.  History of falls.  Memory and ability to understand (cognition).  Work and work Astronomer.  Reproductive health. Screening  You may have the following tests or measurements:  Height, weight, and BMI.  Blood pressure.  Lipid and cholesterol levels. These may be checked every 5 years, or more frequently if you are over 83 years old.  Skin check.  Lung cancer screening. You may have this screening every year starting at age 17 if you have a 30-pack-year history of smoking and currently smoke or have quit within the past 15 years.  Fecal occult blood test (FOBT) of the stool. You may have this test every year starting at age 11.  Flexible sigmoidoscopy or colonoscopy. You may have a sigmoidoscopy every 5 years or a colonoscopy every 10 years starting at age 63.  Hepatitis C blood test.  Hepatitis B blood test.  Sexually transmitted disease (STD) testing.  Diabetes screening. This is done by checking your blood sugar (glucose) after you have not eaten for a while (fasting). You may have this done every 1-3 years.  Bone density scan. This is done to screen for osteoporosis. You may have this done starting at age 36.  Mammogram. This may be done every 1-2 years. Talk to your health  care provider about how often you should have regular mammograms. Talk with your health care provider about your test results, treatment options, and if necessary, the need for more tests. Vaccines   Your health care provider may recommend certain vaccines, such as:  Influenza vaccine. This is recommended every year.  Tetanus, diphtheria, and acellular pertussis (Tdap, Td) vaccine. You may need a Td booster every 10 years.  Zoster vaccine. You may need this after age 58.  Pneumococcal 13-valent conjugate (PCV13) vaccine. One dose is recommended after age 37.  Pneumococcal polysaccharide (PPSV23) vaccine. One dose is recommended after age 38. Talk to your health care provider about which screenings and vaccines you need and how often you need them. This information is not intended to replace advice given to you by your health care provider. Make sure you discuss any questions you have with your health care provider. Document Released: 10/13/2015 Document Revised: 06/05/2016 Document Reviewed: 07/18/2015 Elsevier Interactive Patient Education  2017 [employer].  Fall Prevention in the Home Falls can cause injuries. They can happen to people of all ages. There are many things you can do to make your home safe and to help prevent falls. What can I do on the outside of my home?  Regularly fix the edges of walkways and driveways and fix any cracks.  Remove anything that might make you trip as you walk through a door, such as a raised step or threshold.  Trim any bushes or trees on the path to your home.  Use bright outdoor lighting.  Clear any walking paths of anything that might make someone trip, such as rocks or tools.  Regularly check to see if handrails are loose or broken. Make sure that both sides of any steps have handrails.  Any raised decks and porches should have guardrails on the edges.  Have any leaves, snow, or ice cleared regularly.  Use sand or salt on walking paths during winter.  Clean up any spills in your garage right away. This includes oil or grease spills. What can I do in the bathroom?  Use night lights.  Install grab bars by the toilet and in the  tub and shower. Do not use towel bars as grab bars.  Use non-skid mats or decals in the tub or shower.  If you need to sit down in the shower, use a plastic, non-slip stool.  Keep the floor dry. Clean up any water that spills on the floor as soon as it happens.  Remove soap buildup in the tub or shower regularly.  Attach bath mats securely with double-sided non-slip rug tape.  Do not have throw rugs and other things on the floor that can make you trip. What can I do in the bedroom?  Use night lights.  Make sure that you have a light by your bed that is easy to reach.  Do not use any sheets or blankets that are too big for your bed. They should not hang down onto the floor.  Have a firm chair that has side arms. You can use this for support while you get dressed.  Do not have throw rugs and other things on the floor that can make you trip. What can I do in the kitchen?  Clean up any spills right away.  Avoid walking on wet floors.  Keep items that you use a lot in easy-to-reach places.  If you need to reach something above you, use a strong step stool that has a grab  bar.  Keep electrical cords out of the way.  Do not use floor polish or wax that makes floors slippery. If you must use wax, use non-skid floor wax.  Do not have throw rugs and other things on the floor that can make you trip. What can I do with my stairs?  Do not leave any items on the stairs.  Make sure that there are handrails on both sides of the stairs and use them. Fix handrails that are broken or loose. Make sure that handrails are as long as the stairways.  Check any carpeting to make sure that it is firmly attached to the stairs. Fix any carpet that is loose or worn.  Avoid having throw rugs at the top or bottom of the stairs. If you do have throw rugs, attach them to the floor with carpet tape.  Make sure that you have a light switch at the top of the stairs and the bottom of the stairs. If you  do not have them, ask someone to add them for you. What else can I do to help prevent falls?  Wear shoes that:  Do not have high heels.  Have rubber bottoms.  Are comfortable and fit you well.  Are closed at the toe. Do not wear sandals.  If you use a stepladder:  Make sure that it is fully opened. Do not climb a closed stepladder.  Make sure that both sides of the stepladder are locked into place.  Ask someone to hold it for you, if possible.  Clearly mark and make sure that you can see:  Any grab bars or handrails.  First and last steps.  Where the edge of each step is.  Use tools that help you move around (mobility aids) if they are needed. These include:  Canes.  Walkers.  Scooters.  Crutches.  Turn on the lights when you go into a dark area. Replace any light bulbs as soon as they burn out.  Set up your furniture so you have a clear path. Avoid moving your furniture around.  If any of your floors are uneven, fix them.  If there are any pets around you, be aware of where they are.  Review your medicines with your doctor. Some medicines can make you feel dizzy. This can increase your chance of falling. Ask your doctor what other things that you can do to help prevent falls. This information is not intended to replace advice given to you by your health care provider. Make sure you discuss any questions you have with your health care provider. Document Released: 07/13/2009 Document Revised: 02/22/2016 Document Reviewed: 10/21/2014 Elsevier Interactive Patient Education  2017 [employer].

## 2020-08-22 NOTE — Progress Notes (Signed)
PCP notes:  Health Maintenance: Mammogram- due  Dexa- due    Abnormal Screenings: none   Patient concerns: none   Nurse concerns: none   Next PCP appt.: none 

## 2020-08-22 NOTE — Progress Notes (Signed)
Subjective:   Shawna Mata is a 69 y.o. female who presents for Medicare Annual (Subsequent) preventive examination.  Review of Systems: N/A      I connected with the patient today by telephone and verified that I am speaking with the correct person using two identifiers. Location patient: home Location nurse: work Persons participating in the telephone visit: patient, nurse.   I discussed the limitations, risks, security and privacy concerns of performing an evaluation and management service by telephone and the availability of in person appointments. I also discussed with the patient that there may be a patient responsible charge related to this service. The patient expressed understanding and verbally consented to this telephonic visit.       Cardiac Risk Factors include: advanced age (>33men, >65 women);hypertension;Other (see comment), Risk factor comments: hyperlipidemia     Objective:    Today's Vitals   There is no height or weight on file to calculate BMI.  Advanced Directives 08/22/2020 04/22/2017  Does Patient Have a Medical Advance Directive? No No  Would patient like information on creating a medical advance directive? No - Patient declined No - Patient declined    Current Medications (verified) Outpatient Encounter Medications as of 08/22/2020  Medication Sig  . amLODipine (NORVASC) 5 MG tablet Take 1 tablet (5 mg total) by mouth daily.  . Aspirin Buf,CaCarb-MgCarb-MgO, 81 MG TABS aspirin 81 mg tablet  Take by oral route.  Marland Kitchen atorvastatin (LIPITOR) 20 MG tablet Take 1 tablet (20 mg total) by mouth daily.  . Cholecalciferol (VITAMIN D-1000 MAX ST PO) Take by mouth.  . hydrochlorothiazide (HYDRODIURIL) 25 MG tablet Take 1 tablet (25 mg total) by mouth daily.  . Loratadine (CLARITIN) 10 MG CAPS Claritin  . Omeprazole (PRILOSEC PO) OTC 20 mg   No facility-administered encounter medications on file as of 08/22/2020.    Allergies (verified) Latex and Codeine    History: Past Medical History:  Diagnosis Date  . Allergy   . GERD (gastroesophageal reflux disease)   . Heart murmur   . History of chickenpox   . Hypertension   . Personal history of colonic polyps    Past Surgical History:  Procedure Laterality Date  . BREAST BIOPSY  2004   polyp in the milk gland removed  . ROTATOR CUFF REPAIR Right 2018  . TUBAL LIGATION  1980s   Family History  Problem Relation Age of Onset  . Parkinson's disease Brother   . CAD Brother   . Heart attack Father        CABG   Social History   Socioeconomic History  . Marital status: Married    Spouse name: A.B. Seehafer  . Number of children: 1  . Years of education: 64  . Highest education level: Not on file  Occupational History  . Occupation: office    Comment: Games developer  Tobacco Use  . Smoking status: Never Smoker  . Smokeless tobacco: Never Used  Vaping Use  . Vaping Use: Never used  Substance and Sexual Activity  . Alcohol use: Yes    Alcohol/week: 1.0 standard drink    Types: 1 Glasses of wine per week    Comment: 2 times a month  . Drug use: Never  . Sexual activity: Yes    Birth control/protection: None  Other Topics Concern  . Not on file  Social History Narrative   epworth sleepiness score = 5 (05/26/17)   Social Determinants of Health   Financial Resource Strain: Low Risk   .  Difficulty of Paying Living Expenses: Not hard at all  Food Insecurity: No Food Insecurity  . Worried About Programme researcher, broadcasting/film/videounning Out of Food in the Last Year: Never true  . Ran Out of Food in the Last Year: Never true  Transportation Needs: No Transportation Needs  . Lack of Transportation (Medical): No  . Lack of Transportation (Non-Medical): No  Physical Activity: Inactive  . Days of Exercise per Week: 0 days  . Minutes of Exercise per Session: 0 min  Stress: No Stress Concern Present  . Feeling of Stress : Not at all  Social Connections:   . Frequency of Communication with Friends and  Family: Not on file  . Frequency of Social Gatherings with Friends and Family: Not on file  . Attends Religious Services: Not on file  . Active Member of Clubs or Organizations: Not on file  . Attends BankerClub or Organization Meetings: Not on file  . Marital Status: Not on file    Tobacco Counseling Counseling given: Not Answered   Clinical Intake:  Pre-visit preparation completed: Yes  Pain : No/denies pain     Nutritional Risks: None Diabetes: No  How often do you need to have someone help you when you read instructions, pamphlets, or other written materials from your doctor or pharmacy?: 1 - Never What is the last grade level you completed in school?: associates  Diabetic: No Nutrition Risk Assessment:  Has the patient had any N/V/D within the last 2 months?  No  Does the patient have any non-healing wounds?  No  Has the patient had any unintentional weight loss or weight gain?  No   Diabetes:  Is the patient diabetic?  No  If diabetic, was a CBG obtained today?  N/A Did the patient bring in their glucometer from home?  N/A How often do you monitor your CBG's? N/A.   Financial Strains and Diabetes Management:  Are you having any financial strains with the device, your supplies or your medication? N/A.  Does the patient want to be seen by Chronic Care Management for management of their diabetes?  N/A Would the patient like to be referred to a Nutritionist or for Diabetic Management?  N/A  Interpreter Needed?: No  Information entered by :: CJohnson, LPN   Activities of Daily Living In your present state of health, do you have any difficulty performing the following activities: 08/22/2020 07/28/2020  Hearing? N N  Vision? N N  Difficulty concentrating or making decisions? N N  Walking or climbing stairs? N N  Dressing or bathing? N N  Doing errands, shopping? N N  Preparing Food and eating ? N -  Using the Toilet? N -  In the past six months, have you  accidently leaked urine? N -  Do you have problems with loss of bowel control? N -  Managing your Medications? N -  Managing your Finances? N -  Housekeeping or managing your Housekeeping? N -  Some recent data might be hidden    Patient Care Team: Emi BelfastGessner, Deborah B, FNP as PCP - General (Nurse Practitioner) Emi BelfastGessner, Deborah B, FNP as Nurse Practitioner (Nurse Practitioner)  Indicate any recent Medical Services you may have received from other than [facility] providers in the past year (date may be approximate).     Assessment:   This is a routine wellness examination for Shawna Mata.  Hearing/Vision screen  Hearing Screening   125Hz  250Hz  500Hz  1000Hz  2000Hz  3000Hz  4000Hz  6000Hz  8000Hz   Right ear:  Left ear:           Vision Screening Comments: Patient gets annual eye exams   Dietary issues and exercise activities discussed: Current Exercise Habits: The patient does not participate in regular exercise at present, Exercise limited by: None identified  Goals    . Patient Stated     08/22/2020, I will maintain and continue medications as prescribed.       Depression Screen PHQ 2/9 Scores 08/22/2020 07/28/2020 03/17/2019  PHQ - 2 Score 0 0 1  PHQ- 9 Score 0 - 3    Fall Risk Fall Risk  08/22/2020 07/28/2020 03/17/2019  Falls in the past year? 0 0 0  Number falls in past yr: 0 0 0  Injury with Fall? 0 0 0  Risk for fall due to : Medication side effect - -  Follow up Falls evaluation completed;Falls prevention discussed - -    Any stairs in or around the home? Yes  If so, are there any without handrails? No  Home free of loose throw rugs in walkways, pet beds, electrical cords, etc? Yes  Adequate lighting in your home to reduce risk of falls? Yes   ASSISTIVE DEVICES UTILIZED TO PREVENT FALLS:  Life alert? No  Use of a cane, walker or w/c? No  Grab bars in the bathroom? No  Shower chair or bench in shower? No  Elevated toilet seat or a handicapped toilet? No   TIMED  UP AND GO:  Was the test performed? N/A, telephone visit.   Cognitive Function: MMSE - Mini Mental State Exam 08/22/2020  Orientation to time 5  Orientation to Place 5  Registration 3  Attention/ Calculation 5  Recall 3  Language- repeat 1       Mini Cog  Mini-Cog screen was completed. Maximum score is 22. A value of 0 denotes this part of the MMSE was not completed or the patient failed this part of the Mini-Cog screening.  Immunizations Immunization History  Administered Date(s) Administered  . Influenza-Unspecified 07/16/2020  . PFIZER SARS-COV-2 Vaccination 10/19/2019, 11/06/2019  . Pneumococcal Conjugate-13 07/21/2018  . Pneumococcal Polysaccharide-23 07/28/2020  . Tdap 05/18/2012  . Zoster 08/18/2012    TDAP status: Up to date Flu Vaccine status: Up to date Pneumococcal vaccine status: Up to date Covid-19 vaccine status: Completed vaccines  Qualifies for Shingles Vaccine? Yes   Zostavax completed Yes   Shingrix Completed?: No.    Education has been provided regarding the importance of this vaccine. Patient has been advised to call insurance company to determine out of pocket expense if they have not yet received this vaccine. Advised may also receive vaccine at local pharmacy or Health Dept. Verbalized acceptance and understanding.  Screening Tests Health Maintenance  Topic Date Due  . DEXA SCAN  Never done  . MAMMOGRAM  01/23/2019  . TETANUS/TDAP  05/18/2022  . COLONOSCOPY  02/08/2024  . INFLUENZA VACCINE  Completed  . COVID-19 Vaccine  Completed  . Hepatitis C Screening  Completed  . PNA vac Low Risk Adult  Completed    Health Maintenance  Health Maintenance Due  Topic Date Due  . DEXA SCAN  Never done  . MAMMOGRAM  01/23/2019    Colorectal cancer screening: Completed 02/07/2014. Repeat every 10 years Mammogram status:due, Patient will call and schedule appointment.  Bone Density status: Ordered 07/28/2020. Pt provided with contact info and advised  to call to schedule appt.  Lung Cancer Screening: (Low Dose CT Chest recommended if Age 68-80 years,  30 pack-year currently smoking OR have quit w/in 15 years.) does not qualify.   Additional Screening:  Hepatitis C Screening: does qualify; Completed 07/20/2020  Vision Screening: Recommended annual ophthalmology exams for early detection of glaucoma and other disorders of the eye. Is the patient up to date with their annual eye exam?  Yes  Who is the provider or what is the name of the office in which the patient attends annual eye exams? [facility] If pt is not established with a provider, would they like to be referred to a provider to establish care? No .   Dental Screening: Recommended annual dental exams for proper oral hygiene  Community Resource Referral / Chronic Care Management: CRR required this visit?  No   CCM required this visit?  No      Plan:     I have personally reviewed and noted the following in the patient's chart:   . Medical and social history . Use of alcohol, tobacco or illicit drugs  . Current medications and supplements . Functional ability and status . Nutritional status . Physical activity . Advanced directives . List of other physicians . Hospitalizations, surgeries, and ER visits in previous 12 months . Vitals . Screenings to include cognitive, depression, and falls . Referrals and appointments  In addition, I have reviewed and discussed with patient certain preventive protocols, quality metrics, and best practice recommendations. A written personalized care plan for preventive services as well as general preventive health recommendations were provided to patient.   Due to this being a telephonic visit, the after visit summary with patients personalized plan was offered to patient via office or my-chart.  Patient preferred to pick up at office at next visit or via mychart.   Janalyn Shy, LPN   [id number]

## 2020-08-28 ENCOUNTER — Other Ambulatory Visit: Payer: Self-pay | Admitting: Family Medicine

## 2020-08-28 DIAGNOSIS — Z1231 Encounter for screening mammogram for malignant neoplasm of breast: Secondary | ICD-10-CM

## 2020-09-07 ENCOUNTER — Ambulatory Visit
Admission: RE | Admit: 2020-09-07 | Discharge: 2020-09-07 | Disposition: A | Discharge status: Home / Self Care | Payer: PPO | Source: Ambulatory Visit | Attending: Family Medicine | Admitting: Family Medicine

## 2020-09-07 ENCOUNTER — Other Ambulatory Visit: Payer: Self-pay

## 2020-09-07 DIAGNOSIS — Z1231 Encounter for screening mammogram for malignant neoplasm of breast: Secondary | ICD-10-CM | POA: Diagnosis not present

## 2020-09-13 ENCOUNTER — Other Ambulatory Visit: Payer: Self-pay | Admitting: Family Medicine

## 2020-09-13 DIAGNOSIS — R928 Other abnormal and inconclusive findings on diagnostic imaging of breast: Secondary | ICD-10-CM

## 2020-09-25 ENCOUNTER — Other Ambulatory Visit: Payer: Self-pay

## 2020-09-25 ENCOUNTER — Ambulatory Visit: Payer: PPO

## 2020-09-25 ENCOUNTER — Ambulatory Visit
Admission: RE | Admit: 2020-09-25 | Discharge: 2020-09-25 | Disposition: A | Discharge status: Home / Self Care | Payer: PPO | Source: Ambulatory Visit | Attending: Family Medicine | Admitting: Family Medicine

## 2020-09-25 ENCOUNTER — Other Ambulatory Visit: Payer: Self-pay | Admitting: Family Medicine

## 2020-09-25 DIAGNOSIS — R928 Other abnormal and inconclusive findings on diagnostic imaging of breast: Secondary | ICD-10-CM

## 2020-09-25 DIAGNOSIS — R922 Inconclusive mammogram: Secondary | ICD-10-CM | POA: Diagnosis not present

## 2020-09-25 DIAGNOSIS — N6322 Unspecified lump in the left breast, upper inner quadrant: Secondary | ICD-10-CM | POA: Diagnosis not present

## 2020-09-26 ENCOUNTER — Other Ambulatory Visit: Payer: Self-pay | Admitting: Internal Medicine

## 2020-09-26 DIAGNOSIS — E2839 Other primary ovarian failure: Secondary | ICD-10-CM

## 2020-10-03 ENCOUNTER — Other Ambulatory Visit: Payer: Self-pay | Admitting: Internal Medicine

## 2020-10-03 DIAGNOSIS — N6310 Unspecified lump in the right breast, unspecified quadrant: Secondary | ICD-10-CM

## 2020-10-11 ENCOUNTER — Other Ambulatory Visit: Payer: PPO

## 2020-10-27 ENCOUNTER — Other Ambulatory Visit (INDEPENDENT_AMBULATORY_CARE_PROVIDER_SITE_OTHER): Payer: PPO

## 2020-10-27 ENCOUNTER — Other Ambulatory Visit: Payer: Self-pay

## 2020-10-27 DIAGNOSIS — E7800 Pure hypercholesterolemia, unspecified: Secondary | ICD-10-CM | POA: Diagnosis not present

## 2020-10-27 LAB — LIPID PANEL
Cholesterol: 154 mg/dL (ref 0–200)
HDL: 63.4 mg/dL (ref >39.00)
LDL Cholesterol: 68 mg/dL (ref 0–99)
NonHDL: 90.8
Total CHOL/HDL Ratio: 2
Triglycerides: 116 mg/dL (ref 0.0–149.0)
VLDL: 23.2 mg/dL (ref 0.0–40.0)

## 2020-11-28 ENCOUNTER — Encounter: Payer: PPO | Admitting: Internal Medicine

## 2021-01-17 ENCOUNTER — Encounter: Payer: PPO | Admitting: Internal Medicine

## 2021-01-26 DIAGNOSIS — Z20822 Contact with and (suspected) exposure to covid-19: Secondary | ICD-10-CM | POA: Diagnosis not present

## 2021-01-26 DIAGNOSIS — U071 COVID-19: Secondary | ICD-10-CM | POA: Diagnosis not present

## 2021-01-30 ENCOUNTER — Other Ambulatory Visit: Payer: Self-pay

## 2021-01-30 ENCOUNTER — Ambulatory Visit
Admission: RE | Admit: 2021-01-30 | Discharge: 2021-01-30 | Disposition: A | Discharge status: Home / Self Care | Payer: PPO | Source: Ambulatory Visit | Attending: Internal Medicine | Admitting: Internal Medicine

## 2021-01-30 DIAGNOSIS — M85852 Other specified disorders of bone density and structure, left thigh: Secondary | ICD-10-CM | POA: Diagnosis not present

## 2021-01-30 DIAGNOSIS — Z780 Asymptomatic menopausal state: Secondary | ICD-10-CM | POA: Diagnosis not present

## 2021-01-30 DIAGNOSIS — E2839 Other primary ovarian failure: Secondary | ICD-10-CM

## 2021-03-27 ENCOUNTER — Other Ambulatory Visit: Payer: Self-pay

## 2021-03-27 ENCOUNTER — Ambulatory Visit
Admission: RE | Admit: 2021-03-27 | Discharge: 2021-03-27 | Disposition: A | Discharge status: Home / Self Care | Payer: PPO | Source: Ambulatory Visit | Attending: Internal Medicine | Admitting: Internal Medicine

## 2021-03-27 ENCOUNTER — Other Ambulatory Visit: Payer: Self-pay | Admitting: Internal Medicine

## 2021-03-27 DIAGNOSIS — N6310 Unspecified lump in the right breast, unspecified quadrant: Secondary | ICD-10-CM

## 2021-03-27 DIAGNOSIS — N6011 Diffuse cystic mastopathy of right breast: Secondary | ICD-10-CM | POA: Diagnosis not present

## 2021-04-19 ENCOUNTER — Encounter: Payer: PPO | Admitting: Internal Medicine

## 2021-04-24 ENCOUNTER — Encounter: Payer: PPO | Admitting: Internal Medicine

## 2021-05-01 ENCOUNTER — Ambulatory Visit (INDEPENDENT_AMBULATORY_CARE_PROVIDER_SITE_OTHER): Payer: PPO | Admitting: Internal Medicine

## 2021-05-01 ENCOUNTER — Encounter: Payer: Self-pay | Admitting: Internal Medicine

## 2021-05-01 ENCOUNTER — Other Ambulatory Visit: Payer: Self-pay

## 2021-05-01 VITALS — BP 130/88 | HR 88 | Temp 98.5°F | Resp 18 | Ht 63.0 in | Wt 214.4 lb

## 2021-05-01 DIAGNOSIS — R7303 Prediabetes: Secondary | ICD-10-CM | POA: Diagnosis not present

## 2021-05-01 DIAGNOSIS — E7800 Pure hypercholesterolemia, unspecified: Secondary | ICD-10-CM

## 2021-05-01 DIAGNOSIS — I10 Essential (primary) hypertension: Secondary | ICD-10-CM

## 2021-05-01 DIAGNOSIS — M7989 Other specified soft tissue disorders: Secondary | ICD-10-CM | POA: Diagnosis not present

## 2021-05-01 LAB — COMPREHENSIVE METABOLIC PANEL
ALT: 20 U/L (ref 0–35)
AST: 15 U/L (ref 0–37)
Albumin: 4.1 g/dL (ref 3.5–5.2)
Alkaline Phosphatase: 86 U/L (ref 39–117)
BUN: 20 mg/dL (ref 6–23)
CO2: 31 mEq/L (ref 19–32)
Calcium: 9.3 mg/dL (ref 8.4–10.5)
Chloride: 100 mEq/L (ref 96–112)
Creatinine, Ser: 0.7 mg/dL (ref 0.40–1.20)
GFR: 88.38 mL/min (ref >60.00)
Glucose, Bld: 90 mg/dL (ref 70–99)
Potassium: 3.5 mEq/L (ref 3.5–5.1)
Sodium: 140 mEq/L (ref 135–145)
Total Bilirubin: 0.9 mg/dL (ref 0.2–1.2)
Total Protein: 7.3 g/dL (ref 6.0–8.3)

## 2021-05-01 LAB — CBC
HCT: 39.8 % (ref 36.0–46.0)
Hemoglobin: 13.3 g/dL (ref 12.0–15.0)
MCHC: 33.4 g/dL (ref 30.0–36.0)
MCV: 87 fl (ref 78.0–100.0)
Platelets: 256 10*3/uL (ref 150.0–400.0)
RBC: 4.58 Mil/uL (ref 3.87–5.11)
RDW: 14.3 % (ref 11.5–15.5)
WBC: 8.7 10*3/uL (ref 4.0–10.5)

## 2021-05-01 LAB — BRAIN NATRIURETIC PEPTIDE: Pro B Natriuretic peptide (BNP): 20 pg/mL (ref 0.0–100.0)

## 2021-05-01 LAB — HEMOGLOBIN A1C: Hgb A1c MFr Bld: 6.2 % (ref 4.6–6.5)

## 2021-05-01 NOTE — Progress Notes (Signed)
   Subjective:   Patient ID: Shawna Mata, female    DOB: June 26, 1951, 70 y.o.   MRN: [id number]  HPI The patient is a 70 YO female coming in for transfer of care and with leg swelling and other concerns. Some arthritis in her hands and does a lot of typing.   PMH, [facility], social history reviewed and updated  Review of Systems  Constitutional: Negative.   HENT: Negative.    Eyes: Negative.   Respiratory:  Negative for cough, chest tightness and shortness of breath.   Cardiovascular:  Positive for leg swelling. Negative for chest pain and palpitations.  Gastrointestinal:  Negative for abdominal distention, abdominal pain, constipation, diarrhea, nausea and vomiting.  Musculoskeletal:  Positive for arthralgias.  Skin: Negative.   Neurological: Negative.   Psychiatric/Behavioral: Negative.     Objective:  Physical Exam Constitutional:      Appearance: She is well-developed.  HENT:     Head: Normocephalic and atraumatic.  Cardiovascular:     Rate and Rhythm: Normal rate and regular rhythm.  Pulmonary:     Effort: Pulmonary effort is normal. No respiratory distress.     Breath sounds: Normal breath sounds. No wheezing or rales.  Abdominal:     General: Bowel sounds are normal. There is no distension.     Palpations: Abdomen is soft.     Tenderness: There is no abdominal tenderness. There is no rebound.  Musculoskeletal:     Cervical back: Normal range of motion.     Comments: 1+ edema in the ankles  Skin:    General: Skin is warm and dry.  Neurological:     Mental Status: She is alert and oriented to person, place, and time.     Coordination: Coordination normal.    Vitals:   05/01/21 1456  BP: 130/88  Pulse: 88  Resp: 18  Temp: 98.5 F (36.9 C)  TempSrc: Oral  SpO2: 98%  Weight: 214 lb 6.4 oz (97.3 kg)  Height: 5\' 3"  (1.6 m)    This visit occurred during the SARS-CoV-2 public health emergency.  Safety protocols were in place, including screening questions prior to  the visit, additional usage of staff PPE, and extensive cleaning of exam room while observing appropriate contact time as indicated for disinfecting solutions.   Assessment & Plan:

## 2021-05-01 NOTE — Patient Instructions (Addendum)
Try voltaren gel for the pinky pain.  Ask the pharmacist about the shingrix.  We will check the labs for the leg swelling and if normal will stop amlodipine for 2-3 weeks and see if the swelling improves.  Once off amlodipine monitor the blood pressure 2-3 times per week and if on average it is running above 140/90 let us know and we can replace the amlodipine with another medicine.

## 2021-05-04 DIAGNOSIS — M7989 Other specified soft tissue disorders: Secondary | ICD-10-CM | POA: Insufficient documentation

## 2021-05-04 NOTE — Assessment & Plan Note (Signed)
Taking lipitor 20 mg daily and recent lipid panel at goal. Will not check today. Continue lipitor.

## 2021-05-04 NOTE — Assessment & Plan Note (Signed)
Checking HgA1c to monitor. Not on medication.

## 2021-05-04 NOTE — Assessment & Plan Note (Signed)
Checking CMP and BNP to rule out other processes. Likely related to amlodipine and if labs normal we will stop amlodipine for 2-3 weeks and monitor symptoms. She has fairly low sodium diet so DASH diet unlikely to significantly help swelling.

## 2021-05-04 NOTE — Assessment & Plan Note (Signed)
Leg swelling could be side effect of amlodipine. Checking CMP and adjust as needed. Continue hctz 25 mg daily. Potentially stop amlodipine to see if this helps reduce leg swelling and monitor BP to see if replacement medication is needed.

## 2021-05-23 ENCOUNTER — Encounter: Payer: Self-pay | Admitting: Internal Medicine

## 2021-05-23 DIAGNOSIS — I10 Essential (primary) hypertension: Secondary | ICD-10-CM

## 2021-05-25 MED ORDER — LISINOPRIL 20 MG PO TABS: 20.0000 mg | ORAL_TABLET | Freq: Every day | ORAL | 3 refills | Status: DC

## 2021-05-25 NOTE — Addendum Note (Signed)
Addended by: Myrlene Broker on: 05/25/2021 08:33 AM   Modules accepted: Orders

## 2021-06-12 ENCOUNTER — Other Ambulatory Visit: Payer: Self-pay

## 2021-06-12 ENCOUNTER — Other Ambulatory Visit (INDEPENDENT_AMBULATORY_CARE_PROVIDER_SITE_OTHER): Payer: PPO

## 2021-06-12 ENCOUNTER — Encounter: Payer: Self-pay | Admitting: Internal Medicine

## 2021-06-12 DIAGNOSIS — I10 Essential (primary) hypertension: Secondary | ICD-10-CM

## 2021-06-12 LAB — COMPREHENSIVE METABOLIC PANEL
ALT: 20 U/L (ref 0–35)
AST: 15 U/L (ref 0–37)
Albumin: 4.3 g/dL (ref 3.5–5.2)
Alkaline Phosphatase: 88 U/L (ref 39–117)
BUN: 17 mg/dL (ref 6–23)
CO2: 31 mEq/L (ref 19–32)
Calcium: 9.5 mg/dL (ref 8.4–10.5)
Chloride: 97 mEq/L (ref 96–112)
Creatinine, Ser: 0.74 mg/dL (ref 0.40–1.20)
GFR: 82.61 mL/min (ref >60.00)
Glucose, Bld: 104 mg/dL — ABNORMAL HIGH (ref 70–99)
Potassium: 3.5 mEq/L (ref 3.5–5.1)
Sodium: 136 mEq/L (ref 135–145)
Total Bilirubin: 1.1 mg/dL (ref 0.2–1.2)
Total Protein: 7.7 g/dL (ref 6.0–8.3)

## 2021-06-12 LAB — URINALYSIS, ROUTINE W REFLEX MICROSCOPIC
Bilirubin Urine: NEGATIVE
Ketones, ur: NEGATIVE
Nitrite: POSITIVE — AB
Specific Gravity, Urine: 1.01 (ref 1.000–1.030)
Total Protein, Urine: 100 — AB
Urine Glucose: NEGATIVE
Urobilinogen, UA: 0.2 (ref 0.0–1.0)
pH: 6 (ref 5.0–8.0)

## 2021-06-13 MED ORDER — CEPHALEXIN 500 MG PO CAPS: 500.0000 mg | ORAL_CAPSULE | Freq: Two times a day (BID) | ORAL | 0 refills | Status: DC

## 2021-06-13 NOTE — Addendum Note (Signed)
Addended by: Hillard Danker A on: 06/13/2021 11:44 AM   Modules accepted: Orders

## 2021-07-13 ENCOUNTER — Other Ambulatory Visit: Payer: Self-pay

## 2021-07-13 ENCOUNTER — Encounter: Payer: Self-pay | Admitting: Internal Medicine

## 2021-07-13 ENCOUNTER — Ambulatory Visit (INDEPENDENT_AMBULATORY_CARE_PROVIDER_SITE_OTHER): Payer: PPO | Admitting: Internal Medicine

## 2021-07-13 VITALS — BP 126/88 | HR 79 | Resp 18 | Ht 63.0 in | Wt 211.4 lb

## 2021-07-13 DIAGNOSIS — R059 Cough, unspecified: Secondary | ICD-10-CM | POA: Insufficient documentation

## 2021-07-13 DIAGNOSIS — R053 Chronic cough: Secondary | ICD-10-CM

## 2021-07-13 DIAGNOSIS — Z23 Encounter for immunization: Secondary | ICD-10-CM | POA: Diagnosis not present

## 2021-07-13 MED ORDER — VALSARTAN 80 MG PO TABS: 80.0000 mg | ORAL_TABLET | Freq: Every day | ORAL | 3 refills | Status: DC

## 2021-07-13 NOTE — Progress Notes (Signed)
   Subjective:   Patient ID: Shawna Mata, female    DOB: September 01, 1951, 70 y.o.   MRN: [id number]  HPI The patent is a 70 YO female coming in for cough 6 months dry non-productive. No problems with breathing.  Review of Systems  Constitutional: Negative.   HENT: Negative.    Eyes: Negative.   Respiratory:  Positive for cough. Negative for chest tightness and shortness of breath.   Cardiovascular:  Negative for chest pain, palpitations and leg swelling.  Gastrointestinal:  Negative for abdominal distention, abdominal pain, constipation, diarrhea, nausea and vomiting.  Musculoskeletal: Negative.   Skin: Negative.   Neurological: Negative.   Psychiatric/Behavioral: Negative.     Objective:  Physical Exam Constitutional:      Appearance: She is well-developed.  HENT:     Head: Normocephalic and atraumatic.  Cardiovascular:     Rate and Rhythm: Normal rate and regular rhythm.  Pulmonary:     Effort: Pulmonary effort is normal. No respiratory distress.     Breath sounds: Normal breath sounds. No wheezing or rales.  Abdominal:     General: Bowel sounds are normal. There is no distension.     Palpations: Abdomen is soft.     Tenderness: There is no abdominal tenderness. There is no rebound.  Musculoskeletal:     Cervical back: Normal range of motion.  Skin:    General: Skin is warm and dry.  Neurological:     Mental Status: She is alert and oriented to person, place, and time.     Coordination: Coordination normal.    Vitals:   07/13/21 0841  BP: 126/88  Pulse: 79  Resp: 18  SpO2: 97%  Weight: 211 lb 6.4 oz (95.9 kg)  Height: 5\' 3"  (1.6 m)    This visit occurred during the SARS-CoV-2 public health emergency.  Safety protocols were in place, including screening questions prior to the visit, additional usage of staff PPE, and extensive cleaning of exam room while observing appropriate contact time as indicated for disinfecting solutions.   Assessment & Plan:  Flu shot  given at visit

## 2021-07-13 NOTE — Patient Instructions (Addendum)
We will switch the lisinopril to valsartan. This should help the cough but may take 2-3 months.

## 2021-07-13 NOTE — Assessment & Plan Note (Signed)
Suspect related to ACE-I. Will stop lisinopril and add valsartan 80 mg daily instead. Advised patient it could take 2-3 months for full resolution.

## 2021-07-19 ENCOUNTER — Other Ambulatory Visit: Payer: Self-pay | Admitting: Internal Medicine

## 2021-07-19 DIAGNOSIS — E7800 Pure hypercholesterolemia, unspecified: Secondary | ICD-10-CM

## 2021-07-25 ENCOUNTER — Other Ambulatory Visit: Payer: Self-pay | Admitting: Internal Medicine

## 2021-07-25 DIAGNOSIS — I10 Essential (primary) hypertension: Secondary | ICD-10-CM

## 2021-09-27 ENCOUNTER — Ambulatory Visit
Admission: RE | Admit: 2021-09-27 | Discharge: 2021-09-27 | Disposition: A | Discharge status: Home / Self Care | Payer: PPO | Source: Ambulatory Visit | Attending: Internal Medicine | Admitting: Internal Medicine

## 2021-09-27 DIAGNOSIS — N6310 Unspecified lump in the right breast, unspecified quadrant: Secondary | ICD-10-CM

## 2021-09-27 DIAGNOSIS — R922 Inconclusive mammogram: Secondary | ICD-10-CM | POA: Diagnosis not present

## 2021-11-27 IMAGING — US US BREAST*L* LIMITED INC AXILLA
1 series · 13 of 16 positions shown · non-contrast
Comparison: Previous exams including LEFT breast ultrasound dated
09/25/2020.

CLINICAL DATA: Follow-up for probably benign masses within the LEFT
breast, suspected complicated cysts, originally identified on
ultrasound dated 09/25/2020. History of benign excisional biopsy of
the LEFT breast in 4441.

EXAM:
ULTRASOUND OF THE LEFT BREAST

[Series 1: us breast*left* limited inc axilla · 0.06mm/px · 16 acquisitions, 13 frames shown]
[im 1/16]
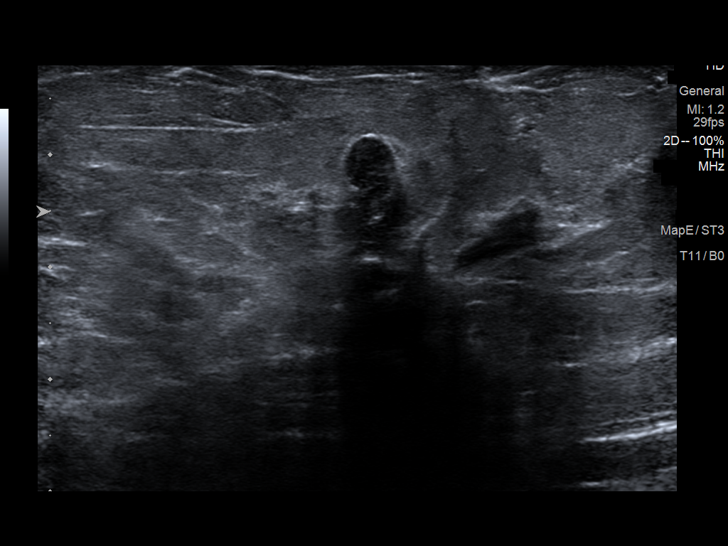
[im 2/16]
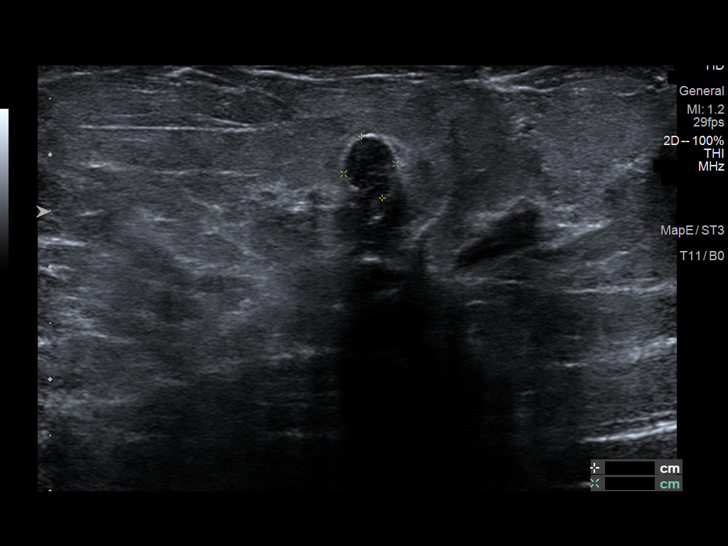
[im 4/16]
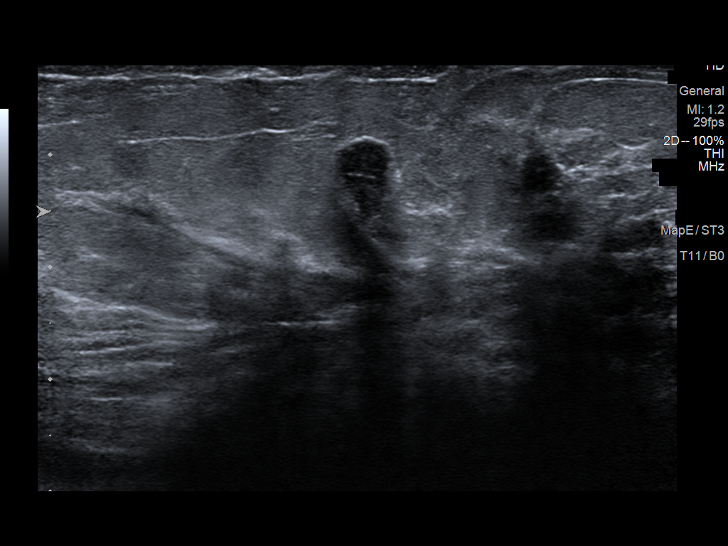
[im 5/16]
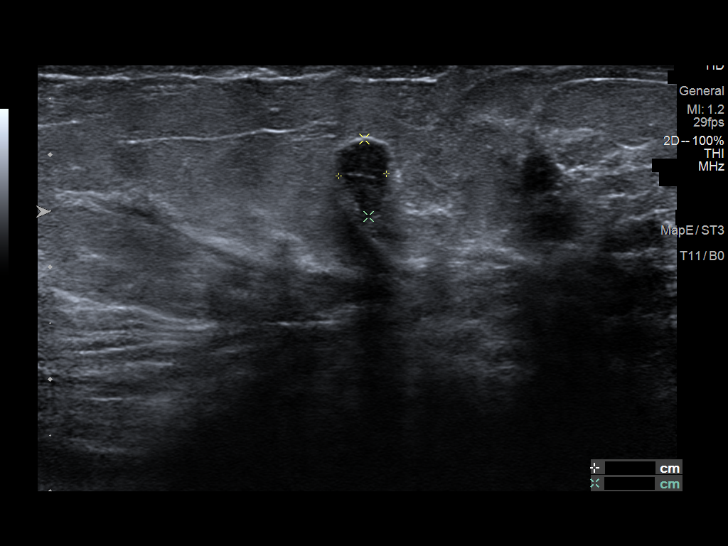
[im 6/16]
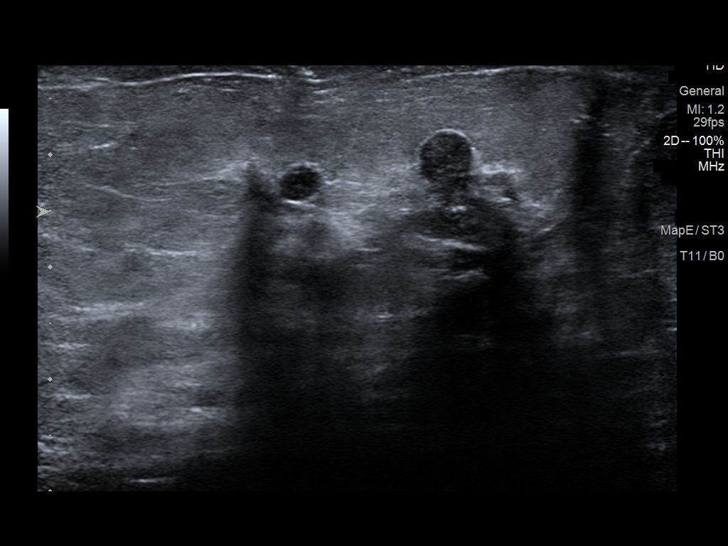
[im 7/16]
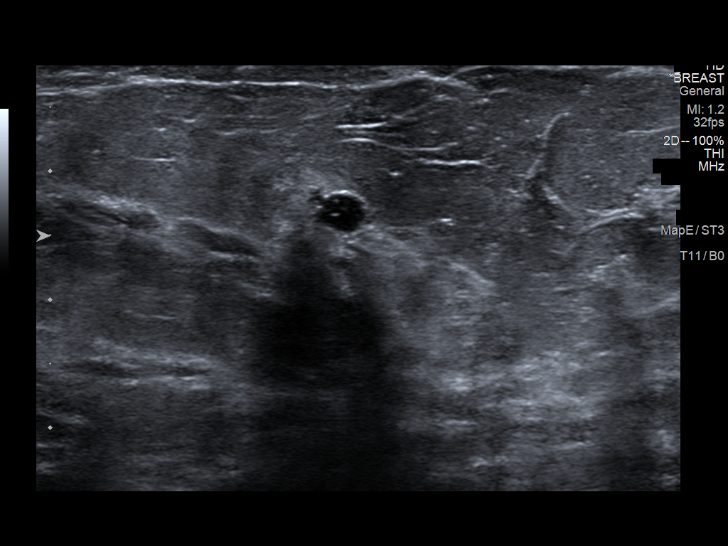
[im 9/16]
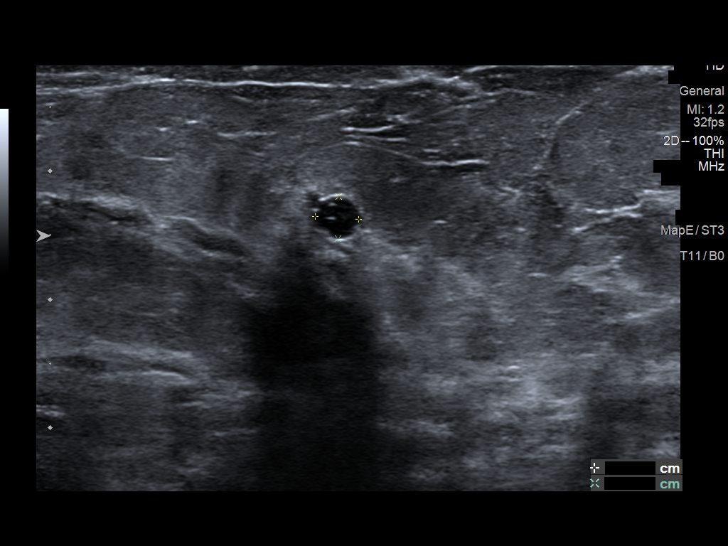
[im 10/16]
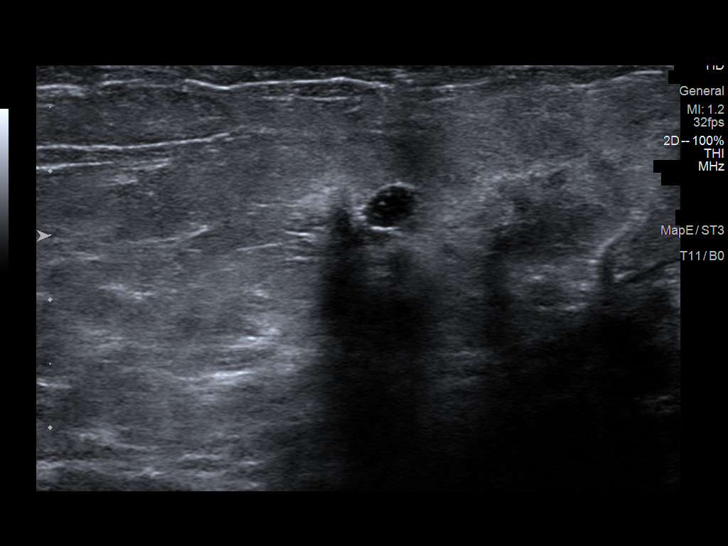
[im 11/16]
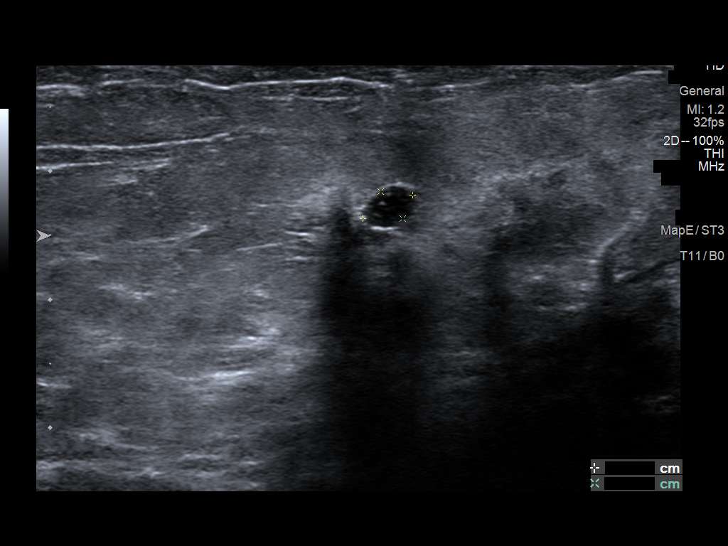
[im 12/16]
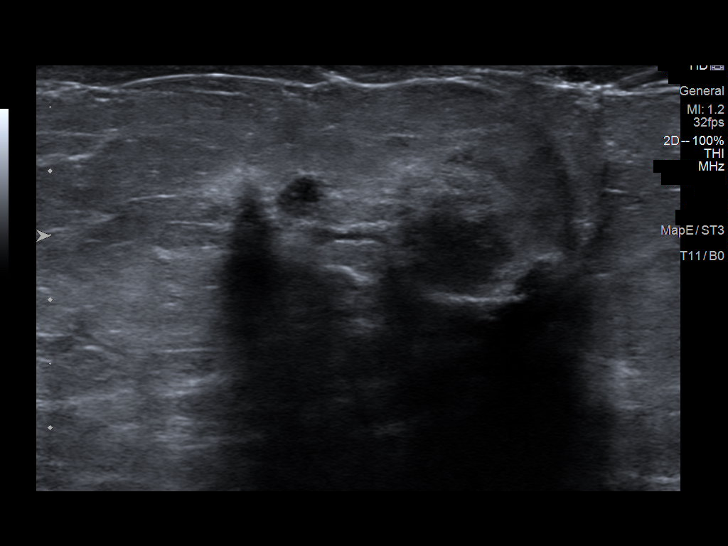
[im 13/16]
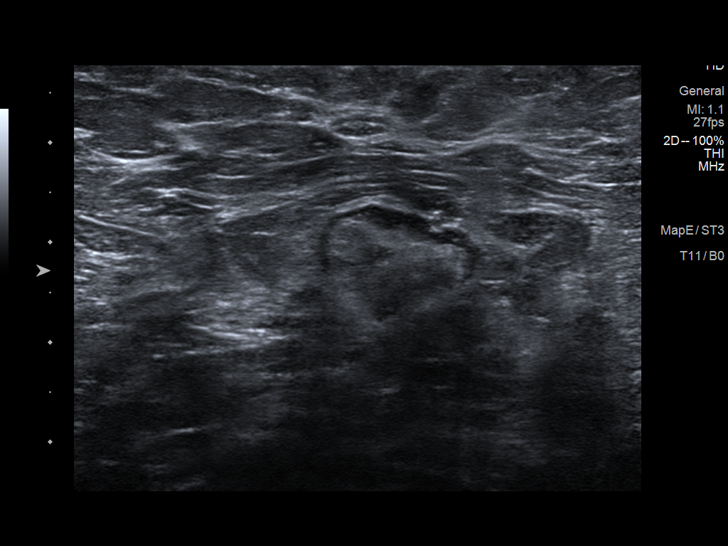
[im 15/16]
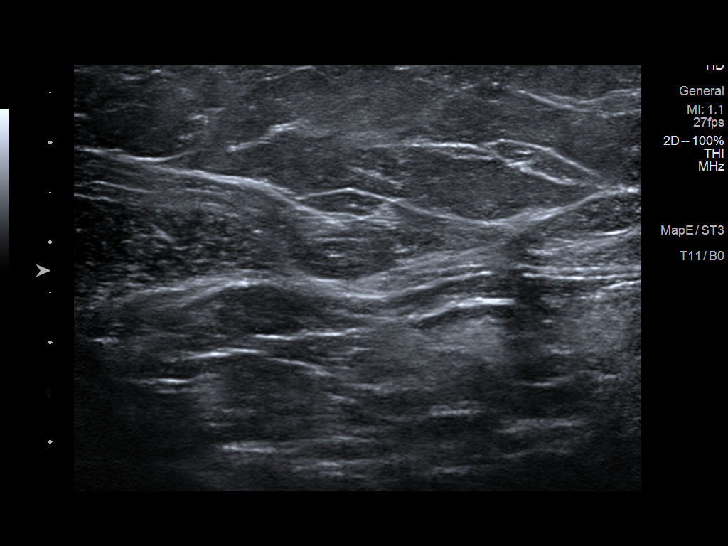
[im 16/16]
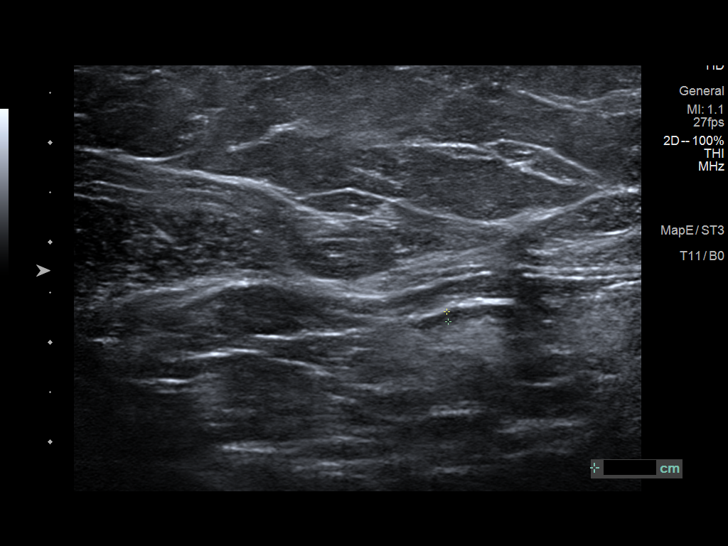

[13 of 16 positions shown; findings below may reference images not displayed]

FINDINGS: Targeted ultrasound is performed, again showing an oval
circumscribed hypoechoic mass in the LEFT breast at the 11 o'clock
axis, 1 cm from the nipple, measuring 7 x 4 x 5 mm, without internal
vascularity, not significantly changed compared to the previous
ultrasound, again most likely a benign complicated cyst.

Also again shown is the oval circumscribed hypoechoic mass in the
LEFT breast at the 11 o'clock axis, 1 cm from the nipple, measuring
4 x 3 x 3 mm, without internal vascularity, also not significantly
changed compared to the previous ultrasound, again most likely a
benign complicated cyst.
IMPRESSION: Probably benign complicated cysts within the LEFT breast at the 11
o'clock axis, measuring 7 mm and 4 mm respectively, not
significantly changed compared to the previous ultrasound. Recommend
additional follow-up diagnostic exam in 6 months to ensure continued
stability.

RECOMMENDATION:
Bilateral diagnostic mammogram, and LEFT breast ultrasound, in 6
months.

I have discussed the findings and recommendations with the patient.
If applicable, a reminder letter will be sent to the patient
regarding the next appointment.

BI-RADS CATEGORY  3: Probably benign.

## 2022-01-04 ENCOUNTER — Ambulatory Visit: Payer: PPO

## 2022-01-11 ENCOUNTER — Ambulatory Visit (INDEPENDENT_AMBULATORY_CARE_PROVIDER_SITE_OTHER): Payer: PPO

## 2022-01-11 DIAGNOSIS — Z0000 Encounter for general adult medical examination without abnormal findings: Secondary | ICD-10-CM

## 2022-01-11 NOTE — Patient Instructions (Signed)
Shawna Mata , ?Thank you for taking time to come for your Medicare Wellness Visit. I appreciate your ongoing commitment to your health goals. Please review the following plan we discussed and let me know if I can assist you in the future.  ? ?Screening recommendations/referrals: ?Colonoscopy: 02/07/2014  due 2025 ?Mammogram: 09/27/2021 ?Bone Density: 01/30/2021 ?Recommended yearly ophthalmology/optometry visit for glaucoma screening and checkup ?Recommended yearly dental visit for hygiene and checkup ? ?Vaccinations: ?Influenza vaccine: completed  ?Pneumococcal vaccine: completed  ?Tdap vaccine: 05/18/2012 ?Shingles vaccine: will consider    ? ?Advanced directives: none  ? ?Conditions/risks identified: none  ? ?Next appointment: none  ? ? ?Preventive Care 70 Years and Older, Female ?Preventive care refers to lifestyle choices and visits with your health care provider that can promote health and wellness. ?What does preventive care include? ?A yearly physical exam. This is also called an annual well check. ?Dental exams once or twice a year. ?Routine eye exams. Ask your health care provider how often you should have your eyes checked. ?Personal lifestyle choices, including: ?Daily care of your teeth and gums. ?Regular physical activity. ?Eating a healthy diet. ?Avoiding tobacco and drug use. ?Limiting alcohol use. ?Practicing safe sex. ?Taking low-dose aspirin every day. ?Taking vitamin and mineral supplements as recommended by your health care provider. ?What happens during an annual well check? ?The services and screenings done by your health care provider during your annual well check will depend on your age, overall health, lifestyle risk factors, and family history of disease. ?Counseling  ?Your health care provider may ask you questions about your: ?Alcohol use. ?Tobacco use. ?Drug use. ?Emotional well-being. ?Home and relationship well-being. ?Sexual activity. ?Eating habits. ?History of falls. ?Memory and  ability to understand (cognition). ?Work and work Astronomer. ?Reproductive health. ?Screening  ?You may have the following tests or measurements: ?Height, weight, and BMI. ?Blood pressure. ?Lipid and cholesterol levels. These may be checked every 5 years, or more frequently if you are over 79 years old. ?Skin check. ?Lung cancer screening. You may have this screening every year starting at age 74 if you have a 30-pack-year history of smoking and currently smoke or have quit within the past 15 years. ?Fecal occult blood test (FOBT) of the stool. You may have this test every year starting at age 20. ?Flexible sigmoidoscopy or colonoscopy. You may have a sigmoidoscopy every 5 years or a colonoscopy every 10 years starting at age 20. ?Hepatitis C blood test. ?Hepatitis B blood test. ?Sexually transmitted disease (STD) testing. ?Diabetes screening. This is done by checking your blood sugar (glucose) after you have not eaten for a while (fasting). You may have this done every 1-3 years. ?Bone density scan. This is done to screen for osteoporosis. You may have this done starting at age 70. ?Mammogram. This may be done every 1-2 years. Talk to your health care provider about how often you should have regular mammograms. ?Talk with your health care provider about your test results, treatment options, and if necessary, the need for more tests. ?Vaccines  ?Your health care provider may recommend certain vaccines, such as: ?Influenza vaccine. This is recommended every year. ?Tetanus, diphtheria, and acellular pertussis (Tdap, Td) vaccine. You may need a Td booster every 10 years. ?Zoster vaccine. You may need this after age 76. ?Pneumococcal 13-valent conjugate (PCV13) vaccine. One dose is recommended after age 43. ?Pneumococcal polysaccharide (PPSV23) vaccine. One dose is recommended after age 46. ?Talk to your health care provider about which screenings and vaccines you need  and how often you need them. ?This information is  not intended to replace advice given to you by your health care provider. Make sure you discuss any questions you have with your health care provider. ?Document Released: 10/13/2015 Document Revised: 06/05/2016 Document Reviewed: 07/18/2015 ?Elsevier Interactive Patient Education ? 2017 [place]. ? ?Fall Prevention in the Home ?Falls can cause injuries. They can happen to people of all ages. There are many things you can do to make your home safe and to help prevent falls. ?What can I do on the outside of my home? ?Regularly fix the edges of walkways and driveways and fix any cracks. ?Remove anything that might make you trip as you walk through a door, such as a raised step or threshold. ?Trim any bushes or trees on the path to your home. ?Use bright outdoor lighting. ?Clear any walking paths of anything that might make someone trip, such as rocks or tools. ?Regularly check to see if handrails are loose or broken. Make sure that both sides of any steps have handrails. ?Any raised decks and porches should have guardrails on the edges. ?Have any leaves, snow, or ice cleared regularly. ?Use sand or salt on walking paths during winter. ?Clean up any spills in your garage right away. This includes oil or grease spills. ?What can I do in the bathroom? ?Use night lights. ?Install grab bars by the toilet and in the tub and shower. Do not use towel bars as grab bars. ?Use non-skid mats or decals in the tub or shower. ?If you need to sit down in the shower, use a plastic, non-slip stool. ?Keep the floor dry. Clean up any water that spills on the floor as soon as it happens. ?Remove soap buildup in the tub or shower regularly. ?Attach bath mats securely with double-sided non-slip rug tape. ?Do not have throw rugs and other things on the floor that can make you trip. ?What can I do in the bedroom? ?Use night lights. ?Make sure that you have a light by your bed that is easy to reach. ?Do not use any sheets or blankets that  are too big for your bed. They should not hang down onto the floor. ?Have a firm chair that has side arms. You can use this for support while you get dressed. ?Do not have throw rugs and other things on the floor that can make you trip. ?What can I do in the kitchen? ?Clean up any spills right away. ?Avoid walking on wet floors. ?Keep items that you use a lot in easy-to-reach places. ?If you need to reach something above you, use a strong step stool that has a grab bar. ?Keep electrical cords out of the way. ?Do not use floor polish or wax that makes floors slippery. If you must use wax, use non-skid floor wax. ?Do not have throw rugs and other things on the floor that can make you trip. ?What can I do with my stairs? ?Do not leave any items on the stairs. ?Make sure that there are handrails on both sides of the stairs and use them. Fix handrails that are broken or loose. Make sure that handrails are as long as the stairways. ?Check any carpeting to make sure that it is firmly attached to the stairs. Fix any carpet that is loose or worn. ?Avoid having throw rugs at the top or bottom of the stairs. If you do have throw rugs, attach them to the floor with carpet tape. ?Make sure that you  have a light switch at the top of the stairs and the bottom of the stairs. If you do not have them, ask someone to add them for you. ?What else can I do to help prevent falls? ?Wear shoes that: ?Do not have high heels. ?Have rubber bottoms. ?Are comfortable and fit you well. ?Are closed at the toe. Do not wear sandals. ?If you use a stepladder: ?Make sure that it is fully opened. Do not climb a closed stepladder. ?Make sure that both sides of the stepladder are locked into place. ?Ask someone to hold it for you, if possible. ?Clearly mark and make sure that you can see: ?Any grab bars or handrails. ?First and last steps. ?Where the edge of each step is. ?Use tools that help you move around (mobility aids) if they are needed. These  include: ?Canes. ?Walkers. ?Scooters. ?Crutches. ?Turn on the lights when you go into a dark area. Replace any light bulbs as soon as they burn out. ?Set up your furniture so you have a clear path. Avoid movi

## 2022-01-11 NOTE — Progress Notes (Signed)
? ?Subjective:  ? Sharion BalloonGloria W Mata is a 71 y.o. female who presents for Medicare Annual (Subsequent) preventive examination. ? ? ?I connected with Rae MarGloria Mata today by telephone and verified that I am speaking with the correct person using two identifiers. ?Location patient: home ?Location provider: work ?Persons participating in the virtual visit: patient, provider. ?  ?I discussed the limitations, risks, security and privacy concerns of performing an evaluation and management service by telephone and the availability of in person appointments. I also discussed with the patient that there may be a patient responsible charge related to this service. The patient expressed understanding and verbally consented to this telephonic visit.  ?  ?Interactive audio and video telecommunications were attempted between this provider and patient, however failed, due to patient having technical difficulties OR patient did not have access to video capability.  We continued and completed visit with audio only. ? ?  ?Review of Systems    ? ?Cardiac Risk Factors include: advanced age (>6055men, 46>65 women);hypertension ? ?   ?Objective:  ?  ?Today's Vitals  ? ?There is no height or weight on file to calculate BMI. ? ? ?  01/11/2022  ?  8:50 AM 08/22/2020  ?  3:36 PM 04/22/2017  ? 10:49 PM  ?Advanced Directives  ?Does Patient Have a Medical Advance Directive? No No No  ?Would patient like information on creating a medical advance directive? No - Patient declined No - Patient declined No - Patient declined  ? ? ?Current Medications (verified) ?Outpatient Encounter Medications as of 01/11/2022  ?Medication Sig  ? Aspirin Buf,CaCarb-MgCarb-MgO, 81 MG TABS aspirin 81 mg tablet ? Take by oral route.  ? atorvastatin (LIPITOR) 20 MG tablet TAKE 1 TABLET BY MOUTH EVERY DAY  ? Cholecalciferol (VITAMIN D-1000 MAX ST PO) Take by mouth.  ? hydrochlorothiazide (HYDRODIURIL) 25 MG tablet TAKE 1 TABLET BY MOUTH EVERY DAY  ? Loratadine 10 MG CAPS Claritin   ? Omeprazole (PRILOSEC PO) OTC 20 mg  ? valsartan (DIOVAN) 80 MG tablet Take 1 tablet (80 mg total) by mouth daily.  ? vitamin B-12 (CYANOCOBALAMIN) 1000 MCG tablet Take 1,000 mcg by mouth daily.  ? ?No facility-administered encounter medications on file as of 01/11/2022.  ? ? ?Allergies (verified) ?Latex and Codeine  ? ?History: ?Past Medical History:  ?Diagnosis Date  ? Allergy   ? GERD (gastroesophageal reflux disease)   ? Heart murmur   ? History of chickenpox   ? Hypertension   ? Personal history of colonic polyps   ? ?Past Surgical History:  ?Procedure Laterality Date  ? BREAST BIOPSY  2004  ? polyp in the milk gland removed  ? ROTATOR CUFF REPAIR Right 2018  ? TUBAL LIGATION  1980s  ? ?Family History  ?Problem Relation Age of Onset  ? Parkinson's disease Brother   ? CAD Brother   ? Heart attack Father   ?     CABG  ? ?Social History  ? ?Socioeconomic History  ? Marital status: Married  ?  Spouse name: A.B. Mach  ? Number of children: 1  ? Years of education: 4816  ? Highest education level: Not on file  ?Occupational History  ? Occupation: office  ?  Comment: Games [employer]  ?Tobacco Use  ? Smoking status: Never  ? Smokeless tobacco: Never  ?Vaping Use  ? Vaping Use: Never used  ?Substance and Sexual Activity  ? Alcohol use: Yes  ?  Alcohol/week: 1.0 standard drink  ?  Types: 1 Glasses  of wine per week  ?  Comment: 2 times a month  ? Drug use: Never  ? Sexual activity: Yes  ?  Birth control/protection: None  ?Other Topics Concern  ? Not on file  ?Social History Narrative  ? epworth sleepiness score = 5 (05/26/17)  ? ?Social Determinants of Health  ? ?Financial Resource Strain: Low Risk   ? Difficulty of Paying Living Expenses: Not hard at all  ?Food Insecurity: No Food Insecurity  ? Worried About Programme researcher, broadcasting/film/video in the Last Year: Never true  ? Ran Out of Food in the Last Year: Never true  ?Transportation Needs: No Transportation Needs  ? Lack of Transportation (Medical): No  ? Lack of  Transportation (Non-Medical): No  ?Physical Activity: Inactive  ? Days of Exercise per Week: 0 days  ? Minutes of Exercise per Session: 0 min  ?Stress: No Stress Concern Present  ? Feeling of Stress : Not at all  ?Social Connections: Socially Integrated  ? Frequency of Communication with Friends and Family: Twice a week  ? Frequency of Social Gatherings with Friends and Family: Twice a week  ? Attends Religious Services: More than 4 times per year  ? Active Member of Clubs or Organizations: Yes  ? Attends Banker Meetings: More than 4 times per year  ? Marital Status: Married  ? ? ?Tobacco Counseling ?Counseling given: Not Answered ? ? ?Clinical Intake: ? ?Pre-visit preparation completed: Yes ? ?Pain : No/denies pain ? ?  ? ?Nutritional Risks: None ?Diabetes: No ? ?How often do you need to have someone help you when you read instructions, pamphlets, or other written materials from your doctor or pharmacy?: 1 - Never ?What is the last grade level you completed in school?: college ? ?Diabetic?no  ? ?Interpreter Needed?: No ? ?Information entered by :: L.Chelsey Kimberley,LPN ? ? ?Activities of Daily Living ? ?  01/11/2022  ?  8:53 AM  ?In your present state of health, do you have any difficulty performing the following activities:  ?Hearing? 0  ?Vision? 0  ?Difficulty concentrating or making decisions? 0  ?Walking or climbing stairs? 0  ?Dressing or bathing? 0  ?Doing errands, shopping? 0  ?Preparing Food and eating ? N  ?Using the Toilet? N  ?In the past six months, have you accidently leaked urine? N  ?Do you have problems with loss of bowel control? N  ?Managing your Medications? N  ?Managing your Finances? N  ?Housekeeping or managing your Housekeeping? N  ? ? ?Patient Care Team: ?Myrlene Broker, MD as PCP - General (Internal Medicine) ?Emi Belfast, FNP as Nurse Practitioner (Nurse Practitioner) ? ?Indicate any recent Medical Services you may have received from other than [facility] providers in the past  year (date may be approximate). ? ?   ?Assessment:  ? This is a routine wellness examination for Kaitlynne. ? ?Hearing/Vision screen ?Vision Screening - Comments:: Annual eye exam wear glasses ? ?Dietary issues and exercise activities discussed: ?Current Exercise Habits: The patient does not participate in regular exercise at present, Exercise limited by: None identified ? ? Goals Addressed   ?None ?  ? ?Depression Screen ? ?  01/11/2022  ?  8:51 AM 01/11/2022  ?  8:49 AM 08/22/2020  ?  3:37 PM 07/28/2020  ?  3:31 PM 03/17/2019  ? 10:06 AM  ?PHQ 2/9 Scores  ?PHQ - 2 Score 0 0 0 0 1  ?PHQ- 9 Score   0  3  ?  ?Fall  Risk ? ?  01/11/2022  ?  8:51 AM 08/22/2020  ?  3:37 PM 07/28/2020  ?  3:32 PM 03/17/2019  ? 10:06 AM  ?Fall Risk   ?Falls in the past year? 0 0 0 0  ?Number falls in past yr: 0 0 0 0  ?Injury with Fall? 0 0 0 0  ?Risk for fall due to :  Medication side effect    ?Follow up Falls evaluation completed Falls evaluation completed;Falls prevention discussed    ? ? ?FALL RISK PREVENTION PERTAINING TO THE HOME: ? ?Any stairs in or around the home? Yes  ?If so, are there any without handrails? No  ?Home free of loose throw rugs in walkways, pet beds, electrical cords, etc? Yes  ?Adequate lighting in your home to reduce risk of falls? Yes  ? ?ASSISTIVE DEVICES UTILIZED TO PREVENT FALLS: ? ?Life alert? No  ?Use of a cane, walker or w/c? No  ?Grab bars in the bathroom? No  ?Shower chair or bench in shower? Yes  ?Elevated toilet seat or a handicapped toilet? Yes  ? ? ? ?Cognitive Function: ?Normal cognitive status assessed by telephone conversation  by this Nurse Health Advisor. No abnormalities found.  ? ? ?  08/22/2020  ?  3:38 PM  ?MMSE - Mini Mental State Exam  ?Orientation to time 5  ?Orientation to Place 5  ?Registration 3  ?Attention/ Calculation 5  ?Recall 3  ?Language- repeat 1  ? ?  ?  ? ?Immunizations ?Immunization History  ?Administered Date(s) Administered  ? Fluad Quad(high Dose 65+) 07/13/2021  ?  Influenza-Unspecified 07/16/2020  ? PFIZER(Purple Top)SARS-COV-2 Vaccination 10/19/2019, 11/06/2019, 07/16/2020  ? Pneumococcal Conjugate-13 07/21/2018  ? Pneumococcal Polysaccharide-23 07/28/2020  ? Tdap 05/18/2012  ? Zoster, L

## 2022-05-08 ENCOUNTER — Ambulatory Visit (INDEPENDENT_AMBULATORY_CARE_PROVIDER_SITE_OTHER): Payer: PPO | Admitting: Emergency Medicine

## 2022-05-08 ENCOUNTER — Encounter: Payer: Self-pay | Admitting: Emergency Medicine

## 2022-05-08 VITALS — BP 126/72 | HR 86 | Temp 98.2°F | Ht 63.0 in | Wt 215.4 lb

## 2022-05-08 DIAGNOSIS — B029 Zoster without complications: Secondary | ICD-10-CM | POA: Insufficient documentation

## 2022-05-08 MED ORDER — VALACYCLOVIR HCL 1 G PO TABS: 1000.0000 mg | ORAL_TABLET | Freq: Three times a day (TID) | ORAL | 0 refills | Status: AC

## 2022-05-08 NOTE — Progress Notes (Signed)
Shawna Mata 71 y.o.   Chief Complaint  Patient presents with   possible shingles in scalp    Noticed bumps/symptoms yesterday     HISTORY OF PRESENT ILLNESS: Acute problem visit.  Patient of Dr. Hillard Danker. This is a 71 y.o. female complaining of possible shingles in her scalp. Noticed blister rash yesterday morning which was preceded by some scalp discomfort and headache. No other complaints or medical concerns today.  HPI   Prior to Admission medications   Medication Sig Start Date End Date Taking? Authorizing Provider  Aspirin Buf,CaCarb-MgCarb-MgO, 81 MG TABS aspirin 81 mg tablet  Take by oral route.   Yes [provider]  atorvastatin (LIPITOR) 20 MG tablet TAKE 1 TABLET BY MOUTH EVERY DAY 07/20/21  Yes Myrlene Broker, MD  Cholecalciferol (VITAMIN D-1000 MAX ST PO) Take by mouth.   Yes [provider]  hydrochlorothiazide (HYDRODIURIL) 25 MG tablet TAKE 1 TABLET BY MOUTH EVERY DAY 07/25/21  Yes Myrlene Broker, MD  Loratadine 10 MG CAPS Claritin   Yes [provider]  Omeprazole (PRILOSEC PO) OTC 20 mg   Yes [provider]  valACYclovir (VALTREX) 1000 MG tablet Take 1 tablet (1,000 mg total) by mouth 3 (three) times daily for 7 days. 05/08/22 05/15/22 Yes Pearline Yerby, Eilleen Kempf, MD  valsartan (DIOVAN) 80 MG tablet Take 1 tablet (80 mg total) by mouth daily. 07/13/21  Yes Myrlene Broker, MD  vitamin B-12 (CYANOCOBALAMIN) 1000 MCG tablet Take 1,000 mcg by mouth daily.   Yes [provider]    Allergies  Allergen Reactions   Latex Rash   Codeine Other (See Comments)    headache    Patient Active Problem List   Diagnosis Date Noted   Cough 07/13/2021   Leg swelling 05/04/2021   Elevated LDL cholesterol level 07/28/2020   Estrogen deficiency 07/28/2020   Essential hypertension 03/17/2019   Prediabetes 03/17/2019   Class 1 drug-induced obesity without serious comorbidity with body mass index (BMI)  of 33.0 to 33.9 in adult 03/17/2019   Shoulder pain 10/07/2017   Murmur 05/26/2017   Preop cardiovascular exam 05/26/2017    Past Medical History:  Diagnosis Date   Allergy    GERD (gastroesophageal reflux disease)    Heart murmur    History of chickenpox    Hypertension    Personal history of colonic polyps     Past Surgical History:  Procedure Laterality Date   BREAST BIOPSY  2004   polyp in the milk gland removed   ROTATOR CUFF REPAIR Right 2018   TUBAL LIGATION  1980s    Social History   Socioeconomic History   Marital status: Married    Spouse name: A.B. Hallgren   Number of children: 1   Years of education: 16   Highest education level: Not on file  Occupational History   Occupation: office    Comment: Games developer  Tobacco Use   Smoking status: Never   Smokeless tobacco: Never  Vaping Use   Vaping Use: Never used  Substance and Sexual Activity   Alcohol use: Yes    Alcohol/week: 1.0 standard drink of alcohol    Types: 1 Glasses of wine per week    Comment: 2 times a month   Drug use: Never   Sexual activity: Yes    Birth control/protection: None  Other Topics Concern   Not on file  Social History Narrative   epworth sleepiness score = 5 (05/26/17)   Social Determinants  of Health   Financial Resource Strain: Low Risk  (01/11/2022)   Overall Financial Resource Strain (CARDIA)    Difficulty of Paying Living Expenses: Not hard at all  Food Insecurity: No Food Insecurity (01/11/2022)   Hunger Vital Sign    Worried About Running Out of Food in the Last Year: Never true    Ran Out of Food in the Last Year: Never true  Transportation Needs: No Transportation Needs (01/11/2022)   PRAPARE - Administrator, Civil Service (Medical): No    Lack of Transportation (Non-Medical): No  Physical Activity: Inactive (01/11/2022)   Exercise Vital Sign    Days of Exercise per Week: 0 days    Minutes of Exercise per Session: 0 min  Stress: No Stress  Concern Present (01/11/2022)   [employer] of Occupational Health - Occupational Stress Questionnaire    Feeling of Stress : Not at all  Social Connections: Socially Integrated (01/11/2022)   Social Connection and Isolation Panel [NHANES]    Frequency of Communication with Friends and Family: Twice a week    Frequency of Social Gatherings with Friends and Family: Twice a week    Attends Religious Services: More than 4 times per year    Active Member of [employer] or Organizations: Yes    Attends Engineer, structural: More than 4 times per year    Marital Status: Married  Catering manager Violence: Not At Risk (01/11/2022)   Humiliation, Afraid, Rape, and Kick questionnaire    Fear of Current or Ex-Partner: No    Emotionally Abused: No    Physically Abused: No    Sexually Abused: No    Family History  Problem Relation Age of Onset   Parkinson's disease Brother    CAD Brother    Heart attack Father        CABG     Review of Systems  Constitutional: Negative.  Negative for chills and fever.  HENT: Negative.  Negative for congestion and sore throat.   Respiratory: Negative.  Negative for cough and shortness of breath.   Cardiovascular:  Negative for chest pain and palpitations.  Gastrointestinal:  Negative for abdominal pain, nausea and vomiting.  Genitourinary: Negative.   Skin:  Positive for rash.  Neurological:  Negative for dizziness and headaches.  All other systems reviewed and are negative.  Today's Vitals   05/08/22 1347  BP: 126/72  Pulse: 86  Temp: 98.2 F (36.8 C)  TempSrc: Oral  SpO2: 96%  Weight: 215 lb 6 oz (97.7 kg)  Height: 5\' 3"  (1.6 m)   Body mass index is 38.15 kg/m.   Physical Exam Vitals reviewed.  Constitutional:      Appearance: Normal appearance.  HENT:     Head: Normocephalic.     Left Ear: Tympanic membrane, ear canal and external ear normal.     Mouth/Throat:     Mouth: Mucous membranes are moist.     Pharynx: Oropharynx is  clear.  Eyes:     Extraocular Movements: Extraocular movements intact.     Conjunctiva/sclera: Conjunctivae normal.     Pupils: Pupils are equal, round, and reactive to light.  Cardiovascular:     Rate and Rhythm: Normal rate.  Pulmonary:     Effort: Pulmonary effort is normal.  Skin:    General: Skin is warm and dry.     Comments: Vesicular and erythematous rash to left frontotemporal scalp  Neurological:     Mental Status: She is alert.  ASSESSMENT & PLAN: Problem List Items Addressed This Visit       Other   Herpes zoster without complication - Primary    Clinically stable.  No eye or ear involvement. Will start valacyclovir 1000 mg 3 times a day for 7 days. Advised to contact the office if no better or worse during the next several days. May take Tylenol and or Advil for pain/headaches as needed.      Relevant Medications   valACYclovir (VALTREX) 1000 MG tablet   Patient Instructions  Shingles  Shingles is an infection. It gives you a painful skin rash and blisters that have fluid in them. Shingles is caused by the same germ (virus) that causes chickenpox. Shingles only happens in people who: Have had chickenpox. Have been given a shot (vaccine) to protect against chickenpox. Shingles is rare in this group. What are the causes? This condition is caused by varicella-zoster virus. This is the same germ that causes chickenpox. After a person is exposed to the germ, the germ stays in the body but is not active (dormant). Shingles develops if the germ becomes active again (is reactivated). This can happen many years after the first exposure to the germ. It is not known what causes this germ to become active again. What increases the risk? People who have had chickenpox or received the chickenpox shot are at risk for shingles. This infection is more common in people who: Are older than 70 years of age. Have a weakened disease-fighting system (immune system), such as  people with: HIV (human immunodeficiency virus). AIDS (acquired immunodeficiency syndrome). Cancer. Are taking medicines that weaken the immune system, such as organ transplant medicines. Have a lot of stress. What are the signs or symptoms? The first symptoms of shingles may be itching, tingling, or pain in an area on your skin. A rash will show on your skin a few days or weeks later. This is what usually happens: The rash is likely to be on one side of your body. The rash usually has a shape like a belt or a band. Over time, the rash turns into fluid-filled blisters. The blisters will break open and change into scabs. The scabs usually dry up in about 2-3 weeks. You may also have: A fever. Chills. A headache. A feeling like you may vomit (nausea). How is this treated? The rash may last for several weeks. There is not a specific cure for this condition. Your doctor may prescribe medicines. Medicines may: Help with pain. Help you get better sooner. Help to prevent long-term problems. Help with itching (antihistamines). If the area involved is on your face, you may need to see a specialist. This may be an eye doctor or an ear, nose, and throat (ENT) doctor. Follow these instructions at home: Medicines Take over-the-counter and prescription medicines only as told by your doctor. Put on an anti-itch cream or numbing cream where you have a rash, blisters, or scabs. Do this as told by your doctor. Helping with itching and discomfort  Put cold, wet cloths (cold compresses) on the area of the rash or blisters as told by your doctor. Cool baths can help you feel better. Try adding baking soda or dry oatmeal to the water to lessen itching. Do not bathe in hot water. Use calamine lotion as told by your doctor. Blister and rash care Keep your rash covered with a loose bandage (dressing). Wear loose clothing that does not rub on your rash. Wash your hands with soap and  water for at least 20  seconds before and after you change your bandage. If you cannot use soap and water, use hand sanitizer. Change your bandage as told by your doctor. Keep your rash and blisters clean. To do this, wash the area with mild soap and cool water as told by your doctor. Check your rash every day for signs of infection. Check for: More redness, swelling, or pain. Fluid or blood. Warmth. Pus or a bad smell. Do not scratch your rash. Do not pick at your blisters. To help you to not scratch: Keep your fingernails clean and cut short. Wear gloves or mittens when you sleep, if scratching is a problem. General instructions Rest as told by your doctor. Wash your hands often with soap and water for at least 20 seconds. If you cannot use soap and water, use hand sanitizer. Doing this lowers your chance of getting a skin infection. Your infection can cause chickenpox in people who have never had chickenpox or never got a chickenpox vaccine shot. If you have blisters that did not change into scabs yet, try not to touch other people or be around other people, especially: Babies. Pregnant women. Children who have areas of red, itchy, or rough skin (eczema). Older people who have organ transplants. People who have a long-term (chronic) illness, like cancer or AIDS. Keep all follow-up visits. How is this prevented? A vaccine shot is the best way to prevent shingles and protect against shingles problems. If you have not had a vaccine shot, talk with your doctor about getting it. Where to find more information Centers for Disease Control and Prevention: [employer]Ex[employer].br Contact a doctor if: Your pain does not get better with medicine. Your pain does not get better after the rash heals. You have any of these signs of infection around the rash: More redness, swelling, or pain. Fluid or blood. Warmth. Pus or a bad smell. You have a fever. Get help right away if: The rash is on your face or nose. You have pain in  your face or pain by your eye. You lose feeling on one side of your face. You have trouble seeing. You have ear pain, or you have ringing in your ear. You have a loss of taste. Your condition gets worse. Summary Shingles gives you a painful skin rash and blisters that have fluid in them. Shingles is caused by the same germ (virus) that causes chickenpox. Keep your rash covered with a loose bandage. Wear loose clothing that does not rub on your rash. If you have blisters that did not change into scabs yet, try not to touch other people or be around people. This information is not intended to replace advice given to you by your health care provider. Make sure you discuss any questions you have with your health care provider. Document Revised: 09/11/2020 Document Reviewed: 09/11/2020 Elsevier Patient Education  2023 Elsevier Inc.     Edwina Barth, MD [facility]

## 2022-05-08 NOTE — Assessment & Plan Note (Signed)
Clinically stable.  No eye or ear involvement. Will start valacyclovir 1000 mg 3 times a day for 7 days. Advised to contact the office if no better or worse during the next several days. May take Tylenol and or Advil for pain/headaches as needed.

## 2022-05-08 NOTE — Patient Instructions (Signed)

## 2022-05-10 ENCOUNTER — Telehealth: Payer: Self-pay

## 2022-05-10 ENCOUNTER — Other Ambulatory Visit: Payer: Self-pay | Admitting: Family Medicine

## 2022-05-10 DIAGNOSIS — B029 Zoster without complications: Secondary | ICD-10-CM

## 2022-05-10 MED ORDER — PREDNISONE 20 MG PO TABS: 40.0000 mg | ORAL_TABLET | Freq: Every day | ORAL | 0 refills | Status: DC

## 2022-05-10 NOTE — Telephone Encounter (Signed)
Called pt and let her know prescription was sent and relayed information below. Pt verbalized understanding

## 2022-05-10 NOTE — Progress Notes (Signed)
   Subjective:    Patient ID: Shawna Mata, female    DOB: November 04, 1950, 71 y.o.   MRN: [id number]  HPI    Review of Systems     Objective:   Physical Exam        Assessment & Plan:

## 2022-05-10 NOTE — Telephone Encounter (Signed)
Pt was seen by Dr. Alvy Bimler on 05/08/22 for shingles. Pt reports having shingles in her scalp. She now has it on her forehead and also in her brow. Pt is concerned with it getting in or around her eye.   Pt is requesting a steroid that was discussed in the visit on 8/9 but didn't think it was necessary.    Pharmacy: [id number] - [place], [place] - [address]

## 2022-05-30 IMAGING — MG DIGITAL DIAGNOSTIC BILAT W/ TOMO W/ CAD
8 series · 8 of 24 positions shown · non-contrast
Comparison: Previous exam(s).

CLINICAL DATA: 70-year-old female presenting for 1 year follow-up
of a probably benign left breast masses and for annual exam of the
right breast.

EXAM:
DIGITAL DIAGNOSTIC BILATERAL MAMMOGRAM WITH TOMOSYNTHESIS AND CAD;
ULTRASOUND LEFT BREAST LIMITED
TECHNIQUE: Bilateral digital diagnostic mammography and breast tomosynthesis
was performed. The images were evaluated with computer-aided
detection.; Targeted ultrasound examination of the left breast was
performed.

[R MLO synth-2D]
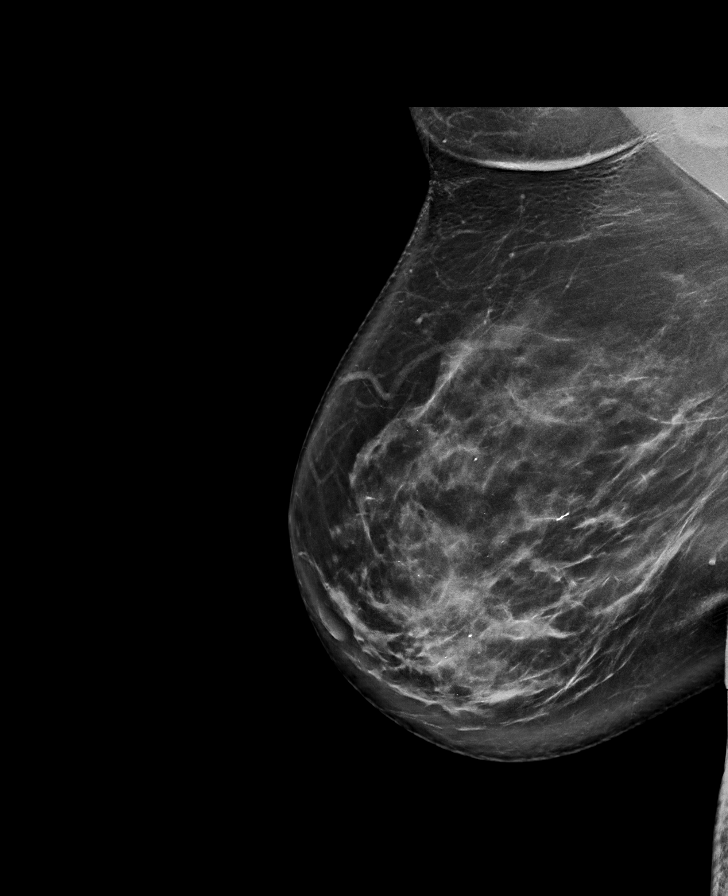

[R CC synth-2D]
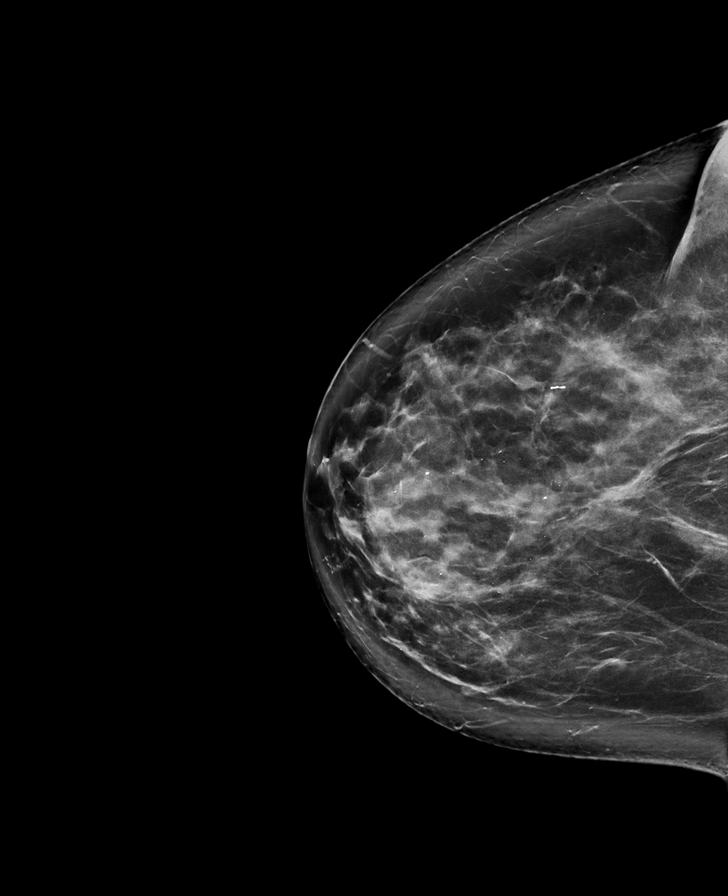

[L MLO synth-2D]
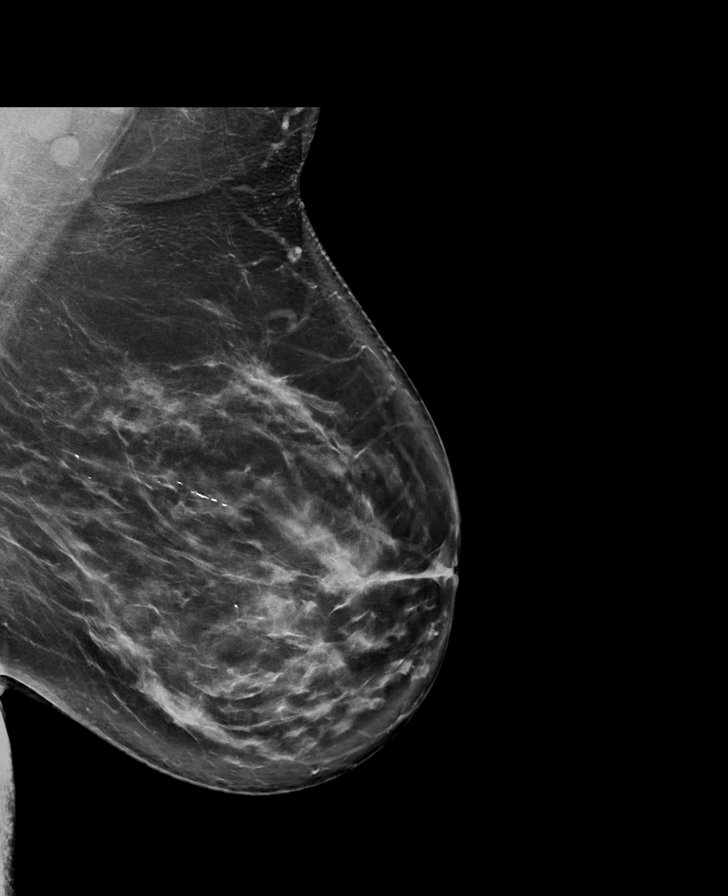

[L CC synth-2D]
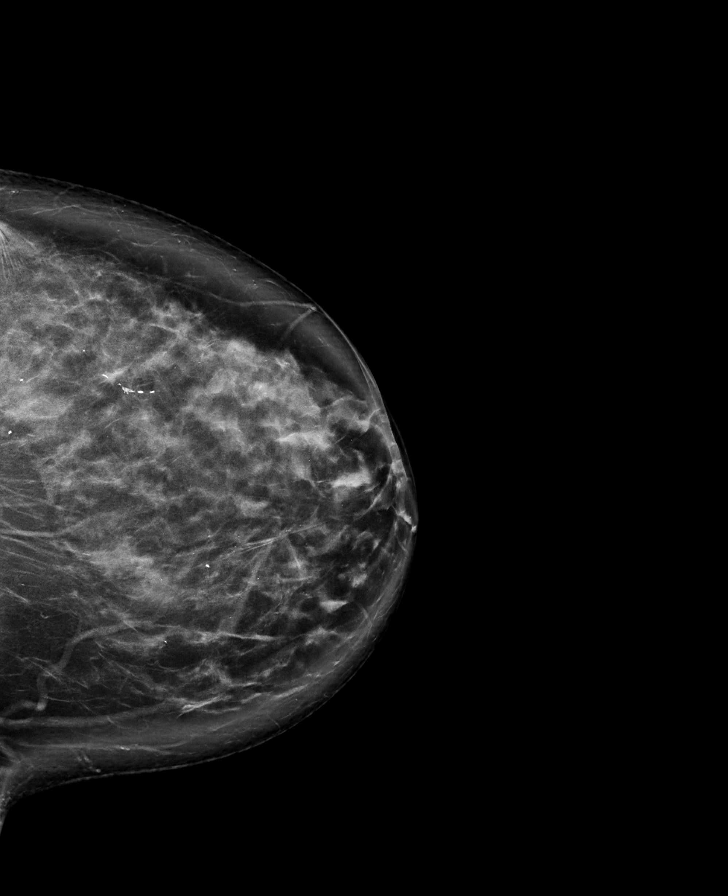

[L MLO tomo · tomo slice 53/104.0]
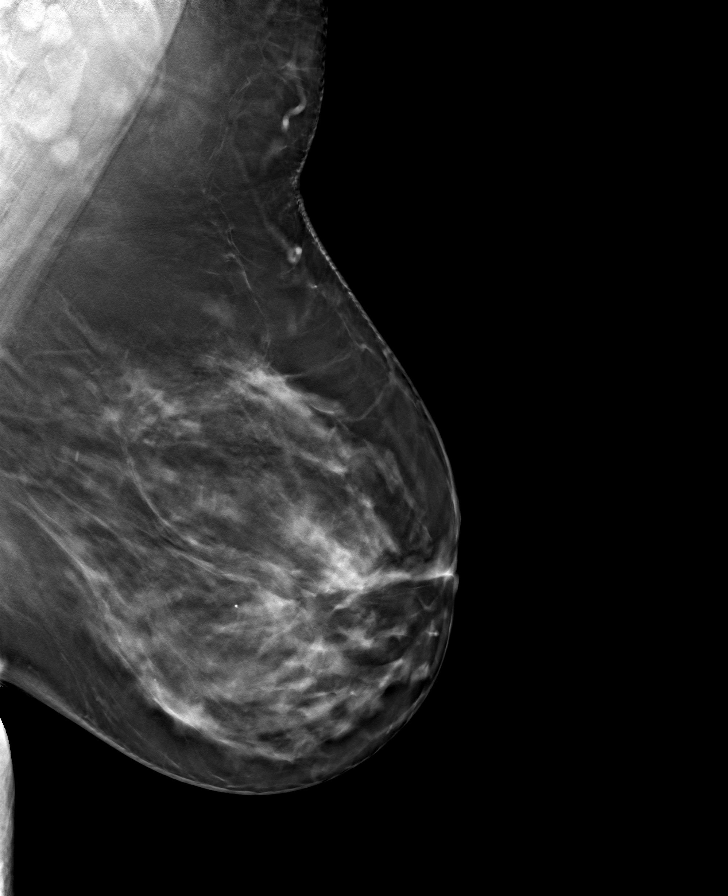

[R MLO tomo · tomo slice 52/103.0]
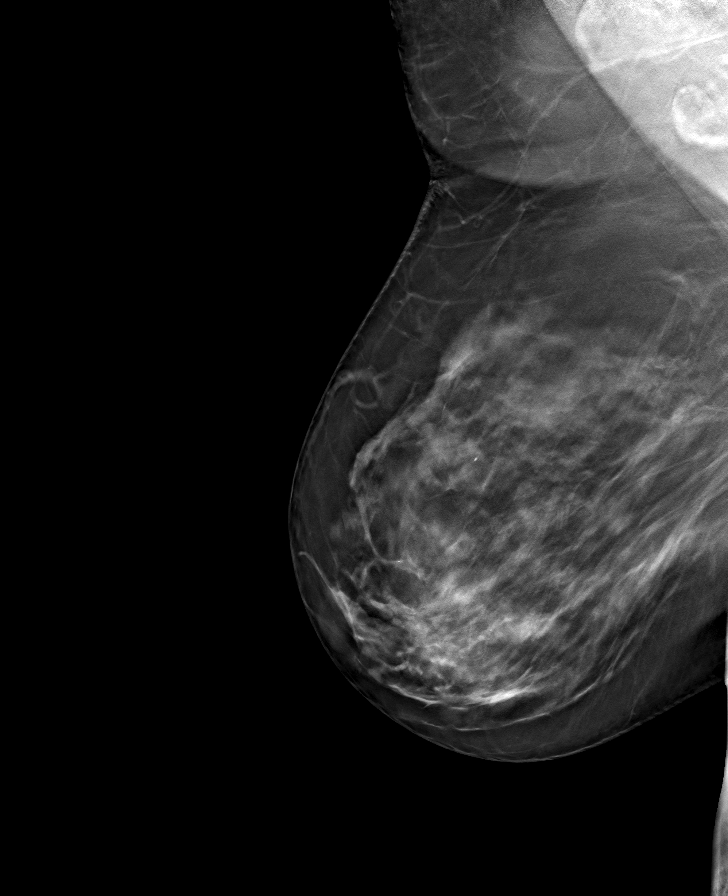

[R CC tomo · tomo slice 47/94.0]
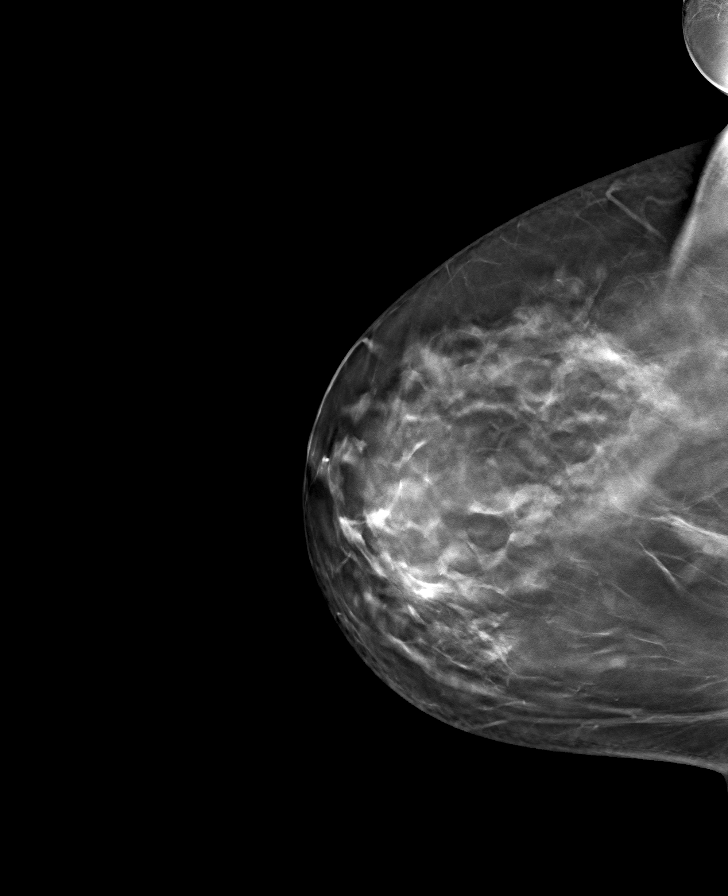

[L CC tomo · tomo slice 51/101.0]
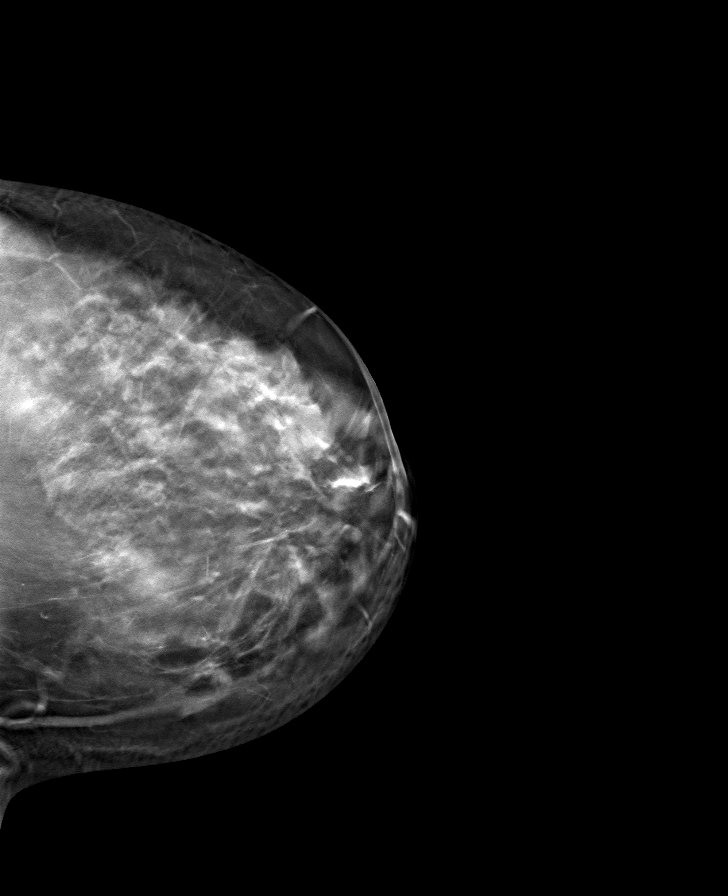

[8 of 24 positions shown; findings below may reference images not displayed]

ACR Breast Density Category c: The breast tissue is heterogeneously
dense, which may obscure small masses.
FINDINGS: Mammogram:

Right breast: No suspicious mass, distortion, or microcalcifications
are identified to suggest presence of malignancy.

Left breast: There are stable postsurgical changes. No suspicious
mass, distortion, or microcalcifications are identified to suggest
presence of malignancy.

Ultrasound:

Targeted ultrasound performed in the left breast at 11 o'clock 1 cm
from the nipple demonstrating an oval circumscribed hypoechoic mass
measuring 0.4 x 0.6 x 0.5 cm., previously measuring 0.7 x 0.4 x
cm. Adjacent to this also at 11 o'clock 1 cm from the nipple there
is a nearly anechoic oval circumscribed mass measuring 0.3 x 0.3 x
0.3 cm, previously measuring 0.4 x 0.3 x 0.3 cm.
IMPRESSION: 1. Stable probably benign adjacent masses in the left breast at 11
o'clock 1 cm from the nipple, most likely complicated cysts.

2.  No mammographic evidence of malignancy in the right breast.

RECOMMENDATION:
Diagnostic bilateral mammogram and left breast ultrasound in 1 year.

I have discussed the findings and recommendations with the patient.
If applicable, a reminder letter will be sent to the patient
regarding the next appointment.

BI-RADS CATEGORY  3: Probably benign.

## 2022-07-15 ENCOUNTER — Telehealth: Payer: Self-pay

## 2022-07-15 ENCOUNTER — Other Ambulatory Visit: Payer: Self-pay | Admitting: *Deleted

## 2022-07-15 DIAGNOSIS — I10 Essential (primary) hypertension: Secondary | ICD-10-CM

## 2022-07-15 MED ORDER — VALSARTAN 80 MG PO TABS: 80.0000 mg | ORAL_TABLET | Freq: Every day | ORAL | 3 refills | Status: DC

## 2022-07-15 NOTE — Telephone Encounter (Signed)
MEDICATION: valsartan (DIOVAN) 80 MG tablet  PHARMACY: [id number] - [place], [place] - [address]  Comments:   **Let patient know to contact pharmacy at the end of the day to make sure medication is ready. **  ** Please notify patient to allow 48-72 hours to process**  **Encourage patient to contact the pharmacy for refills or they can request refills through [facility]**

## 2022-07-15 NOTE — Telephone Encounter (Signed)
sent 

## 2022-07-16 ENCOUNTER — Other Ambulatory Visit: Payer: Self-pay | Admitting: Internal Medicine

## 2022-07-16 DIAGNOSIS — I10 Essential (primary) hypertension: Secondary | ICD-10-CM

## 2022-07-16 DIAGNOSIS — E7800 Pure hypercholesterolemia, unspecified: Secondary | ICD-10-CM

## 2022-07-26 ENCOUNTER — Ambulatory Visit (INDEPENDENT_AMBULATORY_CARE_PROVIDER_SITE_OTHER): Payer: PPO | Admitting: Internal Medicine

## 2022-07-26 ENCOUNTER — Encounter: Payer: Self-pay | Admitting: Internal Medicine

## 2022-07-26 VITALS — BP 120/70 | HR 81 | Temp 97.9°F | Ht 63.0 in | Wt 213.0 lb

## 2022-07-26 DIAGNOSIS — R7303 Prediabetes: Secondary | ICD-10-CM

## 2022-07-26 DIAGNOSIS — Z0000 Encounter for general adult medical examination without abnormal findings: Secondary | ICD-10-CM | POA: Diagnosis not present

## 2022-07-26 DIAGNOSIS — E782 Mixed hyperlipidemia: Secondary | ICD-10-CM

## 2022-07-26 DIAGNOSIS — I10 Essential (primary) hypertension: Secondary | ICD-10-CM

## 2022-07-26 LAB — HEMOGLOBIN A1C: Hgb A1c MFr Bld: 6.2 % (ref 4.6–6.5)

## 2022-07-26 LAB — COMPREHENSIVE METABOLIC PANEL
ALT: 22 U/L (ref 0–35)
AST: 16 U/L (ref 0–37)
Albumin: 4 g/dL (ref 3.5–5.2)
Alkaline Phosphatase: 76 U/L (ref 39–117)
BUN: 18 mg/dL (ref 6–23)
CO2: 29 mEq/L (ref 19–32)
Calcium: 9.2 mg/dL (ref 8.4–10.5)
Chloride: 105 mEq/L (ref 96–112)
Creatinine, Ser: 0.67 mg/dL (ref 0.40–1.20)
GFR: 88.55 mL/min (ref >60.00)
Glucose, Bld: 107 mg/dL — ABNORMAL HIGH (ref 70–99)
Potassium: 3.6 mEq/L (ref 3.5–5.1)
Sodium: 142 mEq/L (ref 135–145)
Total Bilirubin: 0.8 mg/dL (ref 0.2–1.2)
Total Protein: 6.7 g/dL (ref 6.0–8.3)

## 2022-07-26 LAB — CBC
HCT: 38.8 % (ref 36.0–46.0)
Hemoglobin: 13 g/dL (ref 12.0–15.0)
MCHC: 33.5 g/dL (ref 30.0–36.0)
MCV: 88.3 fl (ref 78.0–100.0)
Platelets: 228 10*3/uL (ref 150.0–400.0)
RBC: 4.4 Mil/uL (ref 3.87–5.11)
RDW: 14.5 % (ref 11.5–15.5)
WBC: 5.1 10*3/uL (ref 4.0–10.5)

## 2022-07-26 LAB — LIPID PANEL
Cholesterol: 143 mg/dL (ref 0–200)
HDL: 48.8 mg/dL (ref >39.00)
NonHDL: 94.39
Total CHOL/HDL Ratio: 3
Triglycerides: 228 mg/dL — ABNORMAL HIGH (ref 0.0–149.0)
VLDL: 45.6 mg/dL — ABNORMAL HIGH (ref 0.0–40.0)

## 2022-07-26 LAB — LDL CHOLESTEROL, DIRECT: Direct LDL: 64 mg/dL

## 2022-07-26 NOTE — Progress Notes (Signed)
   Subjective:   Patient ID: Shawna Mata, female    DOB: 05-Feb-1951, 71 y.o.   MRN: [id number]  HPI The patient is here for physical.  PMH, [facility], social history reviewed and updated  Review of Systems  Constitutional: Negative.   HENT: Negative.    Eyes: Negative.   Respiratory:  Negative for cough, chest tightness and shortness of breath.   Cardiovascular:  Negative for chest pain, palpitations and leg swelling.  Gastrointestinal:  Negative for abdominal distention, abdominal pain, constipation, diarrhea, nausea and vomiting.  Musculoskeletal:  Positive for arthralgias.  Skin: Negative.   Neurological: Negative.   Psychiatric/Behavioral: Negative.      Objective:  Physical Exam Constitutional:      Appearance: She is well-developed.  HENT:     Head: Normocephalic and atraumatic.  Cardiovascular:     Rate and Rhythm: Normal rate and regular rhythm.  Pulmonary:     Effort: Pulmonary effort is normal. No respiratory distress.     Breath sounds: Normal breath sounds. No wheezing or rales.  Abdominal:     General: Bowel sounds are normal. There is no distension.     Palpations: Abdomen is soft.     Tenderness: There is no abdominal tenderness. There is no rebound.  Musculoskeletal:        General: Tenderness present.     Cervical back: Normal range of motion.  Skin:    General: Skin is warm and dry.  Neurological:     Mental Status: She is alert and oriented to person, place, and time.     Coordination: Coordination normal.    Vitals:   07/26/22 0909  BP: 120/70  Pulse: 81  Temp: 97.9 F (36.6 C)  TempSrc: Oral  SpO2: 97%  Weight: 213 lb (96.6 kg)  Height: 5\' 3"  (1.6 m)    Assessment & Plan:

## 2022-07-26 NOTE — Assessment & Plan Note (Signed)
Checking HgA1c and adjust as needed.  

## 2022-07-26 NOTE — Assessment & Plan Note (Signed)
Flu shot declines. Covid-19 counseled. Pneumonia complete. Shingrix counseled. Tetanus due at pharmacy. Colonoscopy up to date. Mammogram up to date, pap smear aged out and dexa up to date. Counseled about sun safety and mole surveillance. Counseled about the dangers of distracted driving. Given 10 year screening recommendations.

## 2022-07-26 NOTE — Assessment & Plan Note (Signed)
BP at goal on hctz 25 mg daily and valsartan 80 mg daily. Checking CBC and CMP and adjust as needed.

## 2022-07-26 NOTE — Assessment & Plan Note (Signed)
Taking lipitor 20 mg daily and checking lipid panel. Adjust as needed.  

## 2022-08-11 DIAGNOSIS — Z23 Encounter for immunization: Secondary | ICD-10-CM | POA: Diagnosis not present

## 2022-10-31 ENCOUNTER — Other Ambulatory Visit: Payer: Self-pay | Admitting: Internal Medicine

## 2022-10-31 DIAGNOSIS — Z1231 Encounter for screening mammogram for malignant neoplasm of breast: Secondary | ICD-10-CM

## 2022-12-09 ENCOUNTER — Ambulatory Visit
Admission: RE | Admit: 2022-12-09 | Discharge: 2022-12-09 | Disposition: A | Discharge status: Home / Self Care | Payer: PPO | Source: Ambulatory Visit | Attending: Internal Medicine | Admitting: Internal Medicine

## 2022-12-09 DIAGNOSIS — Z1231 Encounter for screening mammogram for malignant neoplasm of breast: Secondary | ICD-10-CM

## 2022-12-20 ENCOUNTER — Other Ambulatory Visit: Payer: Self-pay | Admitting: Internal Medicine

## 2022-12-20 DIAGNOSIS — N6320 Unspecified lump in the left breast, unspecified quadrant: Secondary | ICD-10-CM

## 2023-01-13 ENCOUNTER — Telehealth: Payer: Self-pay | Admitting: Internal Medicine

## 2023-01-13 NOTE — Telephone Encounter (Signed)
Patient would like to reschedule her AWV. Please callback at [phone]. Please call after 01/13/23 as patient does not have phone today.

## 2023-02-06 ENCOUNTER — Ambulatory Visit
Admission: RE | Admit: 2023-02-06 | Discharge: 2023-02-06 | Disposition: A | Discharge status: Home / Self Care | Payer: PPO | Source: Ambulatory Visit | Attending: Internal Medicine | Admitting: Internal Medicine

## 2023-02-06 DIAGNOSIS — R928 Other abnormal and inconclusive findings on diagnostic imaging of breast: Secondary | ICD-10-CM | POA: Diagnosis not present

## 2023-02-06 DIAGNOSIS — N63 Unspecified lump in unspecified breast: Secondary | ICD-10-CM | POA: Diagnosis not present

## 2023-02-06 DIAGNOSIS — N6320 Unspecified lump in the left breast, unspecified quadrant: Secondary | ICD-10-CM

## 2023-04-07 ENCOUNTER — Ambulatory Visit (INDEPENDENT_AMBULATORY_CARE_PROVIDER_SITE_OTHER): Payer: PPO

## 2023-04-07 VITALS — Ht 63.0 in | Wt 213.0 lb

## 2023-04-07 DIAGNOSIS — Z0000 Encounter for general adult medical examination without abnormal findings: Secondary | ICD-10-CM

## 2023-04-07 NOTE — Patient Instructions (Signed)
[name] , Thank you for taking time to come for your Medicare Wellness Visit. I appreciate your ongoing commitment to your health goals. Please review the following plan we discussed and let me know if I can assist you in the future.   These are the goals we discussed:  Goals       Patient Stated      08/22/2020, I will maintain and continue medications as prescribed.       Weight (lb) < 200 lb (90.7 kg) (pt-stated)        This is a list of the screening recommended for you and due dates:  Health Maintenance  Topic Date Due   Zoster (Shingles) Vaccine (1 of 2) Never done   DTaP/Tdap/Td vaccine (2 - Td or Tdap) 05/18/2022   COVID-19 Vaccine (4 - 2023-24 season) 05/31/2022   Flu Shot  05/01/2023   Mammogram  02/06/2024   Colon Cancer Screening  02/08/2024   Medicare Annual Wellness Visit  04/06/2024   Pneumonia Vaccine  Completed   DEXA scan (bone density measurement)  Completed   Hepatitis C Screening  Completed   HPV Vaccine  Aged Out    Health Maintenance After Age 69 After age 50, you are at a higher risk for certain long-term diseases and infections as well as injuries from falls. Falls are a major cause of broken bones and head injuries in people who are older than age 79. Getting regular preventive care can help to keep you healthy and well. Preventive care includes getting regular testing and making lifestyle changes as recommended by your health care provider. Talk with your health care provider about: Which screenings and tests you should have. A screening is a test that checks for a disease when you have no symptoms. A diet and exercise plan that is right for you. What should I know about screenings and tests to prevent falls? Screening and testing are the best ways to find a health problem early. Early diagnosis and treatment give you the best chance of managing medical conditions that are common after age 43. Certain conditions and lifestyle choices may make you more  likely to have a fall. Your health care provider may recommend: Regular vision checks. Poor vision and conditions such as cataracts can make you more likely to have a fall. If you wear glasses, make sure to get your prescription updated if your vision changes. Medicine review. Work with your health care provider to regularly review all of the medicines you are taking, including over-the-counter medicines. Ask your health care provider about any side effects that may make you more likely to have a fall. Tell your health care provider if any medicines that you take make you feel dizzy or sleepy. Strength and balance checks. Your health care provider may recommend certain tests to check your strength and balance while standing, walking, or changing positions. Foot health exam. Foot pain and numbness, as well as not wearing proper footwear, can make you more likely to have a fall. Screenings, including: Osteoporosis screening. Osteoporosis is a condition that causes the bones to get weaker and break more easily. Blood pressure screening. Blood pressure changes and medicines to control blood pressure can make you feel dizzy. Depression screening. You may be more likely to have a fall if you have a fear of falling, feel depressed, or feel unable to do activities that you used to do. Alcohol use screening. Using too much alcohol can affect your balance and may make you  more likely to have a fall. Follow these instructions at home: Lifestyle Do not drink alcohol if: Your health care provider tells you not to drink. If you drink alcohol: Limit how much you have to: 0-1 drink a day for women. 0-2 drinks a day for men. Know how much alcohol is in your drink. In the [place] one drink equals one 12 oz bottle of beer (355 mL), one 5 oz glass of wine (148 mL), or one 1 oz glass of hard liquor (44 mL). Do not use any products that contain nicotine or tobacco. These products include cigarettes, chewing tobacco, and  vaping devices, such as e-cigarettes. If you need help quitting, ask your health care provider. Activity  Follow a regular exercise program to stay fit. This will help you maintain your balance. Ask your health care provider what types of exercise are appropriate for you. If you need a cane or walker, use it as recommended by your health care provider. Wear supportive shoes that have nonskid soles. Safety  Remove any tripping hazards, such as rugs, cords, and clutter. Install safety equipment such as grab bars in bathrooms and safety rails on stairs. Keep rooms and walkways well-lit. General instructions Talk with your health care provider about your risks for falling. Tell your health care provider if: You fall. Be sure to tell your health care provider about all falls, even ones that seem minor. You feel dizzy, tiredness (fatigue), or off-balance. Take over-the-counter and prescription medicines only as told by your health care provider. These include supplements. Eat a healthy diet and maintain a healthy weight. A healthy diet includes low-fat dairy products, low-fat (lean) meats, and fiber from whole grains, beans, and lots of fruits and vegetables. Stay current with your vaccines. Schedule regular health, dental, and eye exams. Summary Having a healthy lifestyle and getting preventive care can help to protect your health and wellness after age 75. Screening and testing are the best way to find a health problem early and help you avoid having a fall. Early diagnosis and treatment give you the best chance for managing medical conditions that are more common for people who are older than age 87. Falls are a major cause of broken bones and head injuries in people who are older than age 63. Take precautions to prevent a fall at home. Work with your health care provider to learn what changes you can make to improve your health and wellness and to prevent falls. This information is not intended  to replace advice given to you by your health care provider. Make sure you discuss any questions you have with your health care provider. Document Revised: 02/05/2021 Document Reviewed: 02/05/2021 Elsevier Patient Education  2024 [employer].

## 2023-04-07 NOTE — Progress Notes (Addendum)
Subjective:   Shawna Mata is a 72 y.o. female who presents for Medicare Annual (Subsequent) preventive examination.  Visit Complete: Virtual  I connected with  [name] on 04/07/23 by a audio enabled telemedicine application and verified that I am speaking with the correct person using two identifiers.  Patient Location: Home  Provider Location: Office/Clinic  I discussed the limitations of evaluation and management by telemedicine. The patient expressed understanding and agreed to proceed.  Patient Medicare AWV questionnaire was completed by the patient on 03/31/2023; I have confirmed that all information answered by patient is correct and no changes since this date.  Review of Systems     Cardiac Risk Factors include: advanced age (>16men, >70 women);obesity (BMI >30kg/m2)     Objective:    Today's Vitals   04/07/23 0803  Weight: 213 lb (96.6 kg)  Height: 5\' 3"  (1.6 m)   Body mass index is 37.73 kg/m.     04/07/2023    8:18 AM 01/11/2022    8:50 AM 08/22/2020    3:36 PM 04/22/2017   10:49 PM  Advanced Directives  Does Patient Have a Medical Advance Directive? No No No No  Would patient like information on creating a medical advance directive? No - Patient declined No - Patient declined No - Patient declined No - Patient declined    Current Medications (verified) Outpatient Encounter Medications as of 04/07/2023  Medication Sig   Aspirin Buf,CaCarb-MgCarb-MgO, 81 MG TABS aspirin 81 mg tablet  Take by oral route.   atorvastatin (LIPITOR) 20 MG tablet TAKE 1 TABLET BY MOUTH EVERY DAY   Cholecalciferol (VITAMIN D-1000 MAX ST PO) Take by mouth.   hydrochlorothiazide (HYDRODIURIL) 25 MG tablet TAKE 1 TABLET BY MOUTH EVERY DAY   Loratadine 10 MG CAPS Claritin   Omeprazole (PRILOSEC PO) OTC 20 mg   valsartan (DIOVAN) 80 MG tablet Take 1 tablet (80 mg total) by mouth daily.   vitamin B-12 (CYANOCOBALAMIN) 1000 MCG tablet Take 1,000 mcg by mouth daily.    [DISCONTINUED] predniSONE (DELTASONE) 20 MG tablet Take 2 tablets (40 mg total) by mouth daily with breakfast.   No facility-administered encounter medications on file as of 04/07/2023.    Allergies (verified) Latex and Codeine   History: Past Medical History:  Diagnosis Date   Allergy    GERD (gastroesophageal reflux disease)    Heart murmur    History of chickenpox    Hypertension    Personal history of colonic polyps    Past Surgical History:  Procedure Laterality Date   BREAST BIOPSY  2004   polyp in the milk gland removed   ROTATOR CUFF REPAIR Right 2018   TUBAL LIGATION  1980s   Family History  Problem Relation Age of Onset   Parkinson's disease Brother    CAD Brother    Heart attack Father        CABG   Social History   Socioeconomic History   Marital status: Married    Spouse name: A.B. Gupta   Number of children: 1   Years of education: 16   Highest education level: Bachelor's degree (e.g., BA, AB, BS)  Occupational History   Occupation: office    Comment: Games developer  Tobacco Use   Smoking status: Never   Smokeless tobacco: Never  Vaping Use   Vaping Use: Never used  Substance and Sexual Activity   Alcohol use: Yes    Alcohol/week: 1.0 standard drink of alcohol    Types: 1  Glasses of wine per week    Comment: 2 times a month   Drug use: Never   Sexual activity: Yes    Birth control/protection: None  Other Topics Concern   Not on file  Social History Narrative   epworth sleepiness score = 5 (05/26/17)   Social Determinants of Health   Financial Resource Strain: Low Risk  (01/11/2022)   Overall Financial Resource Strain (CARDIA)    Difficulty of Paying Living Expenses: Not hard at all  Food Insecurity: No Food Insecurity (01/11/2022)   Hunger Vital Sign    Worried About Running Out of Food in the Last Year: Never true    Ran Out of Food in the Last Year: Never true  Transportation Needs: No Transportation Needs (01/11/2022)    PRAPARE - Administrator, Civil Service (Medical): No    Lack of Transportation (Non-Medical): No  Physical Activity: Inactive (01/11/2022)   Exercise Vital Sign    Days of Exercise per Week: 0 days    Minutes of Exercise per Session: 0 min  Stress: No Stress Concern Present (01/11/2022)   [employer] of Occupational Health - Occupational Stress Questionnaire    Feeling of Stress : Not at all  Social Connections: Socially Integrated (01/11/2022)   Social Connection and Isolation Panel [NHANES]    Frequency of Communication with Friends and Family: Twice a week    Frequency of Social Gatherings with Friends and Family: Twice a week    Attends Religious Services: More than 4 times per year    Active Member of [employer] or Organizations: Yes    Attends Engineer, structural: More than 4 times per year    Marital Status: Married    Tobacco Counseling Counseling given: Not Answered   Clinical Intake:              How often do you need to have someone help you when you read instructions, pamphlets, or other written materials from your doctor or pharmacy?: (P) 1 - Never         Activities of Daily Living    03/31/2023    9:51 AM 01/12/2023    7:34 PM  In your present state of health, do you have any difficulty performing the following activities:  Hearing? 0 0  Vision? 0 0  Difficulty concentrating or making decisions? 0 0  Walking or climbing stairs? 0 0  Dressing or bathing? 0 0  Doing errands, shopping? 0 0  Preparing Food and eating ? N N  Using the Toilet? N N  In the past six months, have you accidently leaked urine? Y N  Do you have problems with loss of bowel control? N N  Managing your Medications? N N  Managing your Finances? N N  Housekeeping or managing your Housekeeping? N N    Patient Care Team: Myrlene Broker, MD as PCP - General (Internal Medicine) Emi Belfast, FNP as Nurse Practitioner (Nurse  Practitioner)  Indicate any recent Medical Services you may have received from other than [facility] providers in the past year (date may be approximate).     Assessment:   This is a routine wellness examination for [place].  Hearing/Vision screen Hearing Screening - Comments:: Hears better out of L ear but no concerns  Dietary issues and exercise activities discussed:     Goals Addressed               This Visit's Progress     Patient  Stated   On track     08/22/2020, I will maintain and continue medications as prescribed.       Weight (lb) < 200 lb (90.7 kg) (pt-stated)   213 lb (96.6 kg)     Depression Screen    04/07/2023    8:21 AM 07/26/2022    9:08 AM 05/08/2022    1:50 PM 01/11/2022    8:51 AM 01/11/2022    8:49 AM 08/22/2020    3:37 PM 07/28/2020    3:31 PM  PHQ 2/9 Scores  PHQ - 2 Score 1 0 0 0 0 0 0  PHQ- 9 Score 2 0    0     Fall Risk    03/31/2023    9:51 AM 01/12/2023    7:34 PM 07/26/2022    9:08 AM 07/26/2022    9:07 AM 05/08/2022    1:50 PM  Fall Risk   Falls in the past year? 0 0 0 0 0  Number falls in past yr: 0 0 0 0 0  Injury with Fall? 0 0 0 0 0  Risk for fall due to :     No Fall Risks  Follow up   Falls evaluation completed Falls evaluation completed Falls evaluation completed    MEDICARE RISK AT HOME:   TIMED UP AND GO:  Was the test performed?  No    Cognitive Function:    08/22/2020    3:38 PM  MMSE - Mini Mental State Exam  Orientation to time 5  Orientation to Place 5  Registration 3  Attention/ Calculation 5  Recall 3  Language- repeat 1        04/07/2023    8:19 AM  6CIT Screen  What Year? 0 points  What month? 0 points  What time? 0 points  Count back from 20 0 points  Months in reverse 0 points  Repeat phrase 4 points  Total Score 4 points    Immunizations Immunization History  Administered Date(s) Administered   Fluad Quad(high Dose 65+) 07/13/2021   Influenza-Unspecified 07/16/2020   PFIZER(Purple  Top)SARS-COV-2 Vaccination 10/19/2019, 11/06/2019, 07/16/2020   Pneumococcal Conjugate-13 07/21/2018   Pneumococcal Polysaccharide-23 07/28/2020   Tdap 05/18/2012   Zoster, Live 08/18/2012    TDAP status: Up to date  Flu Vaccine status: Up to date  Pneumococcal vaccine status: Up to date  Covid-19 vaccine status: Completed vaccines  Qualifies for Shingles Vaccine? Yes   Zostavax completed Yes   Shingrix Completed?: Yes  Screening Tests Health Maintenance  Topic Date Due   Zoster Vaccines- Shingrix (1 of 2) Never done   DTaP/Tdap/Td (2 - Td or Tdap) 05/18/2022   COVID-19 Vaccine (4 - 2023-24 season) 05/31/2022   INFLUENZA VACCINE  05/01/2023   MAMMOGRAM  02/06/2024   Colonoscopy  02/08/2024   Medicare Annual Wellness (AWV)  04/06/2024   Pneumonia Vaccine 60+ Years old  Completed   DEXA SCAN  Completed   Hepatitis C Screening  Completed   HPV VACCINES  Aged Out    Health Maintenance  Health Maintenance Due  Topic Date Due   Zoster Vaccines- Shingrix (1 of 2) Never done   DTaP/Tdap/Td (2 - Td or Tdap) 05/18/2022   COVID-19 Vaccine (4 - 2023-24 season) 05/31/2022    Colorectal cancer screening: Type of screening: Colonoscopy. Completed 02/07/2014. Repeat every 10 years  Mammogram status: Completed 02/06/2023. Repeat every year  Bone Density status: Completed 01/30/2021. Results reflect: Bone density results: OSTEOPENIA. Repeat every 2 years.  Lung Cancer Screening: (Low Dose CT Chest recommended if Age 57-80 years, 20 pack-year currently smoking OR have quit w/in 15years.) does not qualify.   Lung Cancer Screening Referral: none  Additional Screening:  Hepatitis C Screening: does qualify; Completed 07/20/2020  Vision Screening: Recommended annual ophthalmology exams for early detection of glaucoma and other disorders of the eye. Is the patient up to date with their annual eye exam?  Yes  Who is the provider or what is the name of the office in which the patient  attends annual eye exams? 04/17/2023 at [facility] center If pt is not established with a provider, would they like to be referred to a provider to establish care? No .   Dental Screening: Recommended annual dental exams for proper oral hygiene  Community Resource Referral / Chronic Care Management: CRR required this visit?  No   CCM required this visit?  No     Plan:     I have personally reviewed and noted the following in the patient's chart:   Medical and social history Use of alcohol, tobacco or illicit drugs  Current medications and supplements including opioid prescriptions. Patient is not currently taking opioid prescriptions. Functional ability and status Nutritional status Physical activity Advanced directives List of other physicians Hospitalizations, surgeries, and ER visits in previous 12 months Vitals Screenings to include cognitive, depression, and falls Referrals and appointments  In addition, I have reviewed and discussed with patient certain preventive protocols, quality metrics, and best practice recommendations. A written personalized care plan for preventive services as well as general preventive health recommendations were provided to patient.     Amry Cathy  Arna Medici, CMA   04/07/2023   After Visit Summary: (MyChart) Due to this being a telephonic visit, the after visit summary with patients personalized plan was offered to patient via MyChart   Nurse Notes: none

## 2023-07-03 ENCOUNTER — Other Ambulatory Visit: Payer: Self-pay | Admitting: Internal Medicine

## 2023-07-03 DIAGNOSIS — I10 Essential (primary) hypertension: Secondary | ICD-10-CM

## 2023-07-12 DIAGNOSIS — M25511 Pain in right shoulder: Secondary | ICD-10-CM | POA: Diagnosis not present

## 2023-07-17 ENCOUNTER — Other Ambulatory Visit: Payer: Self-pay | Admitting: Internal Medicine

## 2023-07-17 DIAGNOSIS — I10 Essential (primary) hypertension: Secondary | ICD-10-CM

## 2023-07-17 DIAGNOSIS — E7800 Pure hypercholesterolemia, unspecified: Secondary | ICD-10-CM

## 2023-07-30 ENCOUNTER — Other Ambulatory Visit: Payer: Self-pay | Admitting: Internal Medicine

## 2023-07-30 DIAGNOSIS — I10 Essential (primary) hypertension: Secondary | ICD-10-CM

## 2023-07-30 DIAGNOSIS — E7800 Pure hypercholesterolemia, unspecified: Secondary | ICD-10-CM

## 2023-10-28 ENCOUNTER — Ambulatory Visit: Payer: Self-pay | Admitting: Internal Medicine

## 2023-10-28 ENCOUNTER — Encounter: Payer: Self-pay | Admitting: Family Medicine

## 2023-10-28 ENCOUNTER — Ambulatory Visit (INDEPENDENT_AMBULATORY_CARE_PROVIDER_SITE_OTHER): Payer: PPO | Admitting: Family Medicine

## 2023-10-28 VITALS — BP 130/78 | HR 90 | Temp 98.4°F | Ht 63.0 in | Wt 206.8 lb

## 2023-10-28 DIAGNOSIS — R051 Acute cough: Secondary | ICD-10-CM | POA: Diagnosis not present

## 2023-10-28 MED ORDER — BENZONATATE 100 MG PO CAPS
200.0000 mg | ORAL_CAPSULE | Freq: Three times a day (TID) | ORAL | 0 refills | Status: DC | PRN
Start: 2023-10-28 — End: 2024-04-27

## 2023-10-28 NOTE — Progress Notes (Signed)
   Acute Office Visit  Subjective:     Patient ID: Shawna Mata, female    DOB: [birth year], 73 y.o.   MRN: [id number]  Chief Complaint  Patient presents with   Acute Visit    Diagnosed with flu last week, feels much better just having lingering cough with yellow mucus and nasal congestion.    HPI Patient is in today for evaluation of lingering cough, for the last week. Was diagnosed with flu and was treated with tamiflu. Last dose of tamiflu was 10/24/23.  Has tried cough drops and tylenol with some mild relief. Denies abdominal pain, nausea, vomiting, diarrhea, rash, fever, chills, other symptoms.  Medical hx as outlined below.  ROS Per HPI      Objective:    BP 130/78 (BP Location: Left Arm, Patient Position: Sitting, Cuff Size: Large)   Pulse 90   Temp 98.4 F (36.9 C) (Oral)   Ht 5\' 3"  (1.6 m)   Wt 206 lb 12.8 oz (93.8 kg)   SpO2 98%   BMI 36.63 kg/m    Physical Exam Vitals and nursing note reviewed.  Constitutional:      General: She is not in acute distress. HENT:     Head: Normocephalic and atraumatic.     Nose: No congestion.     Mouth/Throat:     Mouth: Mucous membranes are moist.     Pharynx: Oropharynx is clear. No oropharyngeal exudate or posterior oropharyngeal erythema.  Eyes:     Extraocular Movements: Extraocular movements intact.  Cardiovascular:     Rate and Rhythm: Normal rate and regular rhythm.  Pulmonary:     Effort: Pulmonary effort is normal. No respiratory distress.     Breath sounds: No wheezing, rhonchi or rales.     Comments: Dry cough in office Musculoskeletal:     Cervical back: Normal range of motion and neck supple.  Lymphadenopathy:     Cervical: No cervical adenopathy.  Skin:    General: Skin is warm and dry.  Neurological:     Mental Status: She is alert.     No results found for any visits on 10/28/23.      Assessment & Plan:  1. Acute cough (Primary)  - benzonatate (TESSALON PERLES) 100 MG capsule; Take 2  capsules (200 mg total) by mouth 3 (three) times daily as needed for cough.  Dispense: 20 capsule; Refill: 0   Meds ordered this encounter  Medications   benzonatate (TESSALON PERLES) 100 MG capsule    Sig: Take 2 capsules (200 mg total) by mouth 3 (three) times daily as needed for cough.    Dispense:  20 capsule    Refill:  0    Return if symptoms worsen or fail to improve.  Moshe Cipro, FNP

## 2023-10-28 NOTE — Patient Instructions (Signed)
I have sent in tessalon perles for you to take 200mg  three times per day as needed for cough. This medication should not make you sleepy. Be sure to drink plenty of fluids with this medication.  The cough following the flu can last up to a month, as long as you are continuing to improve, this should be all we need to do.   Follow-up with me for new or worsening symptoms.

## 2023-10-28 NOTE — Telephone Encounter (Signed)
Copied from CRM [id number]. Topic: Clinical - Medical Advice >> Oct 28, 2023  9:51 AM Shawna Mata wrote: Reason for CRM: Patient was recently diagnosed with the flu, and has been taking Tamiflu for the past 5 days. The patient also wants to know if she can get a prescription or if she should take other OTC. Patient is having a bad cough in her chest, with congestion are only symptoms. Patient is requesting a call back.   Chief Complaint: prolonged URI symptoms Symptoms: cough for 2 weeks, coughing up "yellowish golden yellow" phlegm, congestion/runny nose >10 days Frequency: continual Pertinent Negatives: Patient denies SOB, chest pain, fever, blood in sputum Disposition: [] 911 / [] ED /[] Urgent Care (no appt availability in office) / [x] Appointment(In office/virtual)/ []  [place] [facility] [facility]/ [] Home Care/ [] Refused Recommended Disposition /[] [facility]/ []  Follow-up with PCP Additional Notes: Pt reporting she went to walk-in clinic last Monday, took tamiflu full-course, "much improved from that but still cough with congestion." Pt confirms no SOB, fever, chest pain, or blood in sputum, but coughing up "yellowish golden yellow" phlegm and confirms congestion/runny nose for over 10 days. Advised pt be examined in next few days for continued symptoms despite med, scheduled for today with PCP office, confirmed location/appt info. Advised pt call back if SOB, chest pain, worsening. Pt verbalized understanding.  Reason for Disposition  [1] Nasal discharge AND [2] present > 10 days  Answer Assessment - Initial Assessment Questions 1. ONSET: "When did the cough begin?"      Last weekend around 1/18 2. SEVERITY: "How bad is the cough today?"      Getting a little better each day not as frequent today 3. SPUTUM: "Describe the color of your sputum" (none, dry cough; clear, white, yellow, green)     Yellowish golden yellow 4. HEMOPTYSIS: "Are you coughing up any blood?" If so ask: "How much?"  (flecks, streaks, tablespoons, etc.)     denies 5. DIFFICULTY BREATHING: "Are you having difficulty breathing?" If Yes, ask: "How bad is it?" (e.g., mild, moderate, severe)    - MILD: No SOB at rest, mild SOB with walking, speaks normally in sentences, can lie down, no retractions, pulse < 100.    - MODERATE: SOB at rest, SOB with minimal exertion and prefers to sit, cannot lie down flat, speaks in phrases, mild retractions, audible wheezing, pulse 100-120.    - SEVERE: Very SOB at rest, speaks in single words, struggling to breathe, sitting hunched forward, retractions, pulse > 120      denies 6. FEVER: "Do you have a fever?" If Yes, ask: "What is your temperature, how was it measured, and when did it start?"     denies 7. CARDIAC HISTORY: "Do you have any history of heart disease?" (e.g., heart attack, congestive heart failure)      denies 8. LUNG HISTORY: "Do you have any history of lung disease?"  (e.g., pulmonary embolus, asthma, emphysema)     denies 9. PE RISK FACTORS: "Do you have a history of blood clots?" (or: recent major surgery, recent prolonged travel, bedridden)     denies 10. OTHER SYMPTOMS: "Do you have any other symptoms?" (e.g., runny nose, wheezing, chest pain)       A little runny nose, congestion not real bad, no chest pain or wheezing  Protocols used: Cough - Acute Productive-A-AH

## 2023-12-24 ENCOUNTER — Other Ambulatory Visit: Payer: Self-pay | Admitting: Internal Medicine

## 2023-12-24 DIAGNOSIS — I10 Essential (primary) hypertension: Secondary | ICD-10-CM

## 2024-01-29 ENCOUNTER — Other Ambulatory Visit: Payer: Self-pay | Admitting: Internal Medicine

## 2024-01-29 DIAGNOSIS — I10 Essential (primary) hypertension: Secondary | ICD-10-CM

## 2024-03-23 ENCOUNTER — Other Ambulatory Visit: Payer: Self-pay | Admitting: Internal Medicine

## 2024-03-23 DIAGNOSIS — Z1231 Encounter for screening mammogram for malignant neoplasm of breast: Secondary | ICD-10-CM

## 2024-03-30 ENCOUNTER — Ambulatory Visit
Admission: RE | Admit: 2024-03-30 | Discharge: 2024-03-30 | Disposition: A | Discharge status: Home / Self Care | Source: Ambulatory Visit | Attending: Internal Medicine | Admitting: Internal Medicine

## 2024-03-30 DIAGNOSIS — Z1231 Encounter for screening mammogram for malignant neoplasm of breast: Secondary | ICD-10-CM

## 2024-04-05 ENCOUNTER — Ambulatory Visit: Payer: Self-pay | Admitting: Internal Medicine

## 2024-04-07 ENCOUNTER — Encounter: Payer: PPO | Admitting: Internal Medicine

## 2024-04-23 ENCOUNTER — Encounter

## 2024-04-27 ENCOUNTER — Ambulatory Visit (INDEPENDENT_AMBULATORY_CARE_PROVIDER_SITE_OTHER)

## 2024-04-27 VITALS — Ht 63.0 in | Wt 195.0 lb

## 2024-04-27 DIAGNOSIS — Z1211 Encounter for screening for malignant neoplasm of colon: Secondary | ICD-10-CM

## 2024-04-27 DIAGNOSIS — Z0000 Encounter for general adult medical examination without abnormal findings: Secondary | ICD-10-CM

## 2024-04-27 NOTE — Progress Notes (Signed)
 Subjective:   Shawna Mata is a 73 y.o. who presents for a Medicare Wellness preventive visit.  As a reminder, Annual Wellness Visits don't include a physical exam, and some assessments may be limited, especially if this visit is performed virtually. We may recommend an in-person follow-up visit with your provider if needed.  Visit Complete: Virtual I connected with  Shawna Mata on 04/27/24 by a audio enabled telemedicine application and verified that I am speaking with the correct person using two identifiers.  Patient Location: Home  Provider Location: Office/Clinic  I discussed the limitations of evaluation and management by telemedicine. The patient expressed understanding and agreed to proceed.  Vital Signs: Because this visit was a virtual/telehealth visit, some criteria may be missing or patient reported. Any vitals not documented were not able to be obtained and vitals that have been documented are patient reported.  VideoDeclined- This patient declined Librarian, academic. Therefore the visit was completed with audio only.  Persons Participating in Visit: Patient.  AWV Questionnaire: Yes: Patient Medicare AWV questionnaire was completed by the patient on 04/26/2024; I have confirmed that all information answered by patient is correct and no changes since this date.  Cardiac Risk Factors include: advanced age (>66men, >39 women);dyslipidemia;hypertension;obesity (BMI >30kg/m2)     Objective:    Today's Vitals   04/27/24 0938  Weight: 195 lb (88.5 kg)  Height: 5' 3 (1.6 m)   Body mass index is 34.54 kg/m.     04/27/2024    9:38 AM 04/07/2023    8:18 AM 01/11/2022    8:50 AM 08/22/2020    3:36 PM 04/22/2017   10:49 PM  Advanced Directives  Does Patient Have a Medical Advance Directive? No No No No No   Would patient like information on creating a medical advance directive? Yes (MAU/Ambulatory/Procedural Areas - Information given) No -  Patient declined No - Patient declined No - Patient declined No - Patient declined      Data saved with a previous flowsheet row definition    Current Medications (verified) Outpatient Encounter Medications as of 04/27/2024  Medication Sig   Aspirin Buf,CaCarb-MgCarb-MgO, 81 MG TABS aspirin 81 mg tablet  Take by oral route.   atorvastatin  (LIPITOR) 20 MG tablet TAKE 1 TABLET BY MOUTH EVERY DAY   Cholecalciferol (VITAMIN D -1000 MAX ST PO) Take by mouth.   hydrochlorothiazide  (HYDRODIURIL ) 25 MG tablet TAKE 1 TABLET BY MOUTH EVERY DAY   Loratadine 10 MG CAPS Claritin   Omeprazole (PRILOSEC PO) OTC 20 mg   valsartan  (DIOVAN ) 80 MG tablet TAKE 1 TABLET BY MOUTH EVERY DAY   vitamin B-12 (CYANOCOBALAMIN) 1000 MCG tablet Take 1,000 mcg by mouth daily.   [DISCONTINUED] benzonatate  (TESSALON  PERLES) 100 MG capsule Take 2 capsules (200 mg total) by mouth 3 (three) times daily as needed for cough.   No facility-administered encounter medications on file as of 04/27/2024.    Allergies (verified) Latex and Codeine   History: Past Medical History:  Diagnosis Date   Allergy    GERD (gastroesophageal reflux disease)    Heart murmur    History of chickenpox    Hypertension    Personal history of colonic polyps    Past Surgical History:  Procedure Laterality Date   BREAST BIOPSY  2004   polyp in the milk gland removed   ROTATOR CUFF REPAIR Right 2018   TUBAL LIGATION  1980s   Family History  Problem Relation Age of Onset   Parkinson's  disease Brother    CAD Brother    Heart attack Father        CABG   Social History   Socioeconomic History   Marital status: Married    Spouse name: Shawna Mata   Number of children: 1   Years of education: 16   Highest education level: Bachelor's degree (e.g., BA, AB, BS)  Occupational History   Occupation: office    Comment: Games developer  Tobacco Use   Smoking status: Never   Smokeless tobacco: Never  Vaping Use   Vaping status:  Never Used  Substance and Sexual Activity   Alcohol use: Not Currently   Drug use: Never   Sexual activity: Yes    Birth control/protection: None  Other Topics Concern   Not on file  Social History Narrative   epworth sleepiness score = 5 (05/26/17)   Social Drivers of Health   Financial Resource Strain: Low Risk  (04/27/2024)   Overall Financial Resource Strain (CARDIA)    Difficulty of Paying Living Expenses: Not very hard  Food Insecurity: No Food Insecurity (04/27/2024)   Hunger Vital Sign    Worried About Running Out of Food in the Last Year: Never true    Ran Out of Food in the Last Year: Never true  Transportation Needs: No Transportation Needs (04/27/2024)   PRAPARE - Administrator, Civil Service (Medical): No    Lack of Transportation (Non-Medical): No  Physical Activity: Insufficiently Active (04/27/2024)   Exercise Vital Sign    Days of Exercise per Week: 2 days    Minutes of Exercise per Session: 10 min  Stress: No Stress Concern Present (04/27/2024)   [employer] of Occupational Health - Occupational Stress Questionnaire    Feeling of Stress: Only a little  Social Connections: Socially Integrated (04/27/2024)   Social Connection and Isolation Panel    Frequency of Communication with Friends and Family: More than three times a week    Frequency of Social Gatherings with Friends and Family: Three times a week    Attends Religious Services: 1 to 4 times per year    Active Member of Clubs or Organizations: Yes    Attends Engineer, structural: More than 4 times per year    Marital Status: Married    Tobacco Counseling Counseling given: No    Clinical Intake:  Pre-visit preparation completed: Yes  Pain : No/denies pain     BMI - recorded: 34.54 Nutritional Status: BMI > 30  Obese Nutritional Risks: None Diabetes: No  Lab Results  Component Value Date   HGBA1C 6.2 07/26/2022   HGBA1C 6.2 05/01/2021   HGBA1C 6.2 07/20/2020      How often do you need to have someone help you when you read instructions, pamphlets, or other written materials from your doctor or pharmacy?: 1 - Never  Interpreter Needed?: No  Information entered by :: Verdie Saba, CMA   Activities of Daily Living     04/27/2024    9:41 AM 04/26/2024    8:40 PM  In your present state of health, do you have any difficulty performing the following activities:  Hearing? 0 0  Vision? 0 0  Difficulty concentrating or making decisions? 0 0  Walking or climbing stairs? 0 0  Dressing or bathing? 0 0  Doing errands, shopping? 0 0  Preparing Food and eating ? N N  Using the Toilet? N N  In the past six months, have you accidently leaked urine?  N N  Do you have problems with loss of bowel control? N N  Managing your Medications? N N  Managing your Finances? N N  Housekeeping or managing your Housekeeping? N N    Patient Care Team: Rollene Almarie LABOR, MD as PCP - General (Internal Medicine) Avram Barnie NOVAK, FNP as Nurse Practitioner (Nurse Practitioner) Portia Fireman, OD (Optometry)  I have updated your Care Teams any recent Medical Services you may have received from other providers in the past year.     Assessment:   This is a routine wellness examination for [place].  Hearing/Vision screen Hearing Screening - Comments:: Denies hearing difficulties   Vision Screening - Comments:: Wears rx glasses - up to date with routine eye exams with Fireman Portia   Goals Addressed               This Visit's Progress     Patient Stated (pt-stated)        Patient stated she plans to continue watching her diet better       Depression Screen     04/27/2024    9:41 AM 04/07/2023    8:21 AM 07/26/2022    9:08 AM 05/08/2022    1:50 PM 01/11/2022    8:51 AM 01/11/2022    8:49 AM 08/22/2020    3:37 PM  PHQ 2/9 Scores  PHQ - 2 Score 0 1 0 0 0 0 0  PHQ- 9 Score 0 2 0    0    Fall Risk     04/27/2024    9:41 AM 04/26/2024    8:40 PM  03/31/2023    9:51 AM 01/12/2023    7:34 PM 07/26/2022    9:08 AM  Fall Risk   Falls in the past year? 0 1 0 0 0  Comment confirmed with pt - no falls      Number falls in past yr: 0 0 0 0 0  Injury with Fall? 0 0 0 0 0  Risk for fall due to : No Fall Risks      Follow up Falls evaluation completed;Falls prevention discussed    Falls evaluation completed      Data saved with a previous flowsheet row definition    MEDICARE RISK AT HOME:  Medicare Risk at Home Any stairs in or around the home?: No If so, are there any without handrails?: No Home free of loose throw rugs in walkways, pet beds, electrical cords, etc?: Yes Adequate lighting in your home to reduce risk of falls?: Yes Life alert?: No Use of a cane, walker or w/c?: No Grab bars in the bathroom?: No Shower chair or bench in shower?: Yes Elevated toilet seat or a handicapped toilet?: Yes  TIMED UP AND GO:  Was the test performed?  No  Cognitive Function: 6CIT completed    08/22/2020    3:38 PM  MMSE - Mini Mental State Exam  Orientation to time 5  Orientation to Place 5  Registration 3  Attention/ Calculation 5  Recall 3  Language- repeat 1        04/27/2024    9:43 AM 04/07/2023    8:19 AM  6CIT Screen  What Year? 0 points 0 points  What month? 0 points 0 points  What time? 0 points 0 points  Count back from 20 0 points 0 points  Months in reverse 0 points 0 points  Repeat phrase 0 points 4 points  Total Score 0 points 4 points  Immunizations Immunization History  Administered Date(s) Administered   Fluad Quad(high Dose 65+) 07/13/2021   Influenza-Unspecified 07/16/2020   PFIZER(Purple Top)SARS-COV-2 Vaccination 10/19/2019, 11/06/2019, 07/16/2020   Pneumococcal Conjugate-13 07/21/2018   Pneumococcal Polysaccharide-23 07/28/2020   Tdap 05/18/2012   Zoster, Live 08/18/2012    Screening Tests Health Maintenance  Topic Date Due   Zoster Vaccines- Shingrix (1 of 2) 02/16/2002   DTaP/Tdap/Td (2  - Td or Tdap) 05/18/2022   COVID-19 Vaccine (4 - 2024-25 season) 06/01/2023   Colonoscopy  02/08/2024   INFLUENZA VACCINE  04/30/2024   MAMMOGRAM  03/30/2025   Medicare Annual Wellness (AWV)  04/27/2025   Pneumococcal Vaccine: 50+ Years  Completed   DEXA SCAN  Completed   Hepatitis C Screening  Completed   Hepatitis B Vaccines  Aged Out   HPV VACCINES  Aged Out   Meningococcal B Vaccine  Aged Out    Health Maintenance  Health Maintenance Due  Topic Date Due   Zoster Vaccines- Shingrix (1 of 2) 02/16/2002   DTaP/Tdap/Td (2 - Td or Tdap) 05/18/2022   COVID-19 Vaccine (4 - 2024-25 season) 06/01/2023   Colonoscopy  02/08/2024   Health Maintenance Items Addressed:  Referral sent to GI for colonoscopy - to Dr Avram  Additional Screening:  Vision Screening: Recommended annual ophthalmology exams for early detection of glaucoma and other disorders of the eye. Would you like a referral to an eye doctor? No  Patient stated has had her eye exam w/[facility] with Marylen Grise in early 2025.  Dental Screening: Recommended annual dental exams for proper oral hygiene  Community Resource Referral / Chronic Care Management: CRR required this visit?  No   CCM required this visit?  No   Plan:    I have personally reviewed and noted the following in the patient's chart:   Medical and social history Use of alcohol, tobacco or illicit drugs  Current medications and supplements including opioid prescriptions. Patient is not currently taking opioid prescriptions. Functional ability and status Nutritional status Physical activity Advanced directives List of other physicians Hospitalizations, surgeries, and ER visits in previous 12 months Vitals Screenings to include cognitive, depression, and falls Referrals and appointments  In addition, I have reviewed and discussed with patient certain preventive protocols, quality metrics, and best practice recommendations. A  written personalized care plan for preventive services as well as general preventive health recommendations were provided to patient.   Verdie CHRISTELLA Saba, CMA   04/27/2024   After Visit Summary: (MyChart) Due to this being a telephonic visit, the after visit summary with patients personalized plan was offered to patient via MyChart   Notes: Scheduled a CPE appt w/PCP for 05/2024.

## 2024-04-27 NOTE — Patient Instructions (Signed)
 Shawna Mata , Thank you for taking time out of your busy schedule to complete your Annual Wellness Visit with me. I enjoyed our conversation and look forward to speaking with you again next year. I, as well as your care team,  appreciate your ongoing commitment to your health goals. Please review the following plan we discussed and let me know if I can assist you in the future. Your Game plan/ To Do List    Referrals: If you haven't heard from the office you've been referred to, please reach out to them at the phone provided.  Referral to [facility] for a Screening Colonoscopy Follow up Visits: Next Medicare AWV with our clinical staff: 05/05/2025 - in the office Have you seen your provider in the last 6 months (3 months if uncontrolled diabetes)? No Next Office Visit with your provider: 06/04/2024   Clinician Recommendations:  Aim for 30 minutes of exercise or brisk walking, 6-8 glasses of water, and 5 servings of fruits and vegetables each day. Educated and advised on getting a Shingles vaccines in 2025.      This is a list of the screening recommended for you and due dates:  Health Maintenance  Topic Date Due   Zoster (Shingles) Vaccine (1 of 2) 02/16/2002   DTaP/Tdap/Td vaccine (2 - Td or Tdap) 05/18/2022   COVID-19 Vaccine (4 - 2024-25 season) 06/01/2023   Colon Cancer Screening  02/08/2024   Flu Shot  04/30/2024   Mammogram  03/30/2025   Medicare Annual Wellness Visit  04/27/2025   Pneumococcal Vaccine for age over 39  Completed   DEXA scan (bone density measurement)  Completed   Hepatitis C Screening  Completed   Hepatitis B Vaccine  Aged Out   HPV Vaccine  Aged Out   Meningitis B Vaccine  Aged Out    Advanced directives: (Provided) Advance directive discussed with you today. I have provided a copy for you to complete at home and have notarized. Once this is complete, please bring a copy in to our office so we can scan it into your chart.  Advance Care Planning is important because  it:  [x]  Makes sure you receive the medical care that is consistent with your values, goals, and preferences  [x]  It provides guidance to your family and loved ones and reduces their decisional burden about whether or not they are making the right decisions based on your wishes.  Follow the link provided in your after visit summary or read over the paperwork we have mailed to you to help you started getting your Advance Directives in place. If you need assistance in completing these, please reach out to us  so that we can help you!

## 2024-06-04 ENCOUNTER — Encounter: Admitting: Internal Medicine

## 2024-06-15 ENCOUNTER — Encounter: Admitting: Internal Medicine

## 2024-06-21 ENCOUNTER — Ambulatory Visit (INDEPENDENT_AMBULATORY_CARE_PROVIDER_SITE_OTHER): Admitting: Internal Medicine

## 2024-06-21 ENCOUNTER — Encounter: Payer: Self-pay | Admitting: Internal Medicine

## 2024-06-21 VITALS — BP 120/82 | HR 82 | Temp 98.0°F | Ht 63.0 in | Wt 199.0 lb

## 2024-06-21 DIAGNOSIS — I10 Essential (primary) hypertension: Secondary | ICD-10-CM | POA: Diagnosis not present

## 2024-06-21 DIAGNOSIS — E782 Mixed hyperlipidemia: Secondary | ICD-10-CM

## 2024-06-21 DIAGNOSIS — E7800 Pure hypercholesterolemia, unspecified: Secondary | ICD-10-CM | POA: Diagnosis not present

## 2024-06-21 DIAGNOSIS — R7303 Prediabetes: Secondary | ICD-10-CM

## 2024-06-21 DIAGNOSIS — Z0000 Encounter for general adult medical examination without abnormal findings: Secondary | ICD-10-CM

## 2024-06-21 LAB — CBC
HCT: 41.1 % (ref 36.0–46.0)
Hemoglobin: 13.7 g/dL (ref 12.0–15.0)
MCHC: 33.3 g/dL (ref 30.0–36.0)
MCV: 87.7 fl (ref 78.0–100.0)
Platelets: 252 K/uL (ref 150.0–400.0)
RBC: 4.68 Mil/uL (ref 3.87–5.11)
RDW: 14.1 % (ref 11.5–15.5)
WBC: 5.9 K/uL (ref 4.0–10.5)

## 2024-06-21 LAB — LIPID PANEL
Cholesterol: 143 mg/dL (ref 0–200)
HDL: 55.6 mg/dL (ref >39.00)
LDL Cholesterol: 58 mg/dL (ref 0–99)
NonHDL: 87.81
Total CHOL/HDL Ratio: 3
Triglycerides: 148 mg/dL (ref 0.0–149.0)
VLDL: 29.6 mg/dL (ref 0.0–40.0)

## 2024-06-21 LAB — COMPREHENSIVE METABOLIC PANEL WITH GFR
ALT: 21 U/L (ref 0–35)
AST: 19 U/L (ref 0–37)
Albumin: 4.4 g/dL (ref 3.5–5.2)
Alkaline Phosphatase: 71 U/L (ref 39–117)
BUN: 18 mg/dL (ref 6–23)
CO2: 29 meq/L (ref 19–32)
Calcium: 9.4 mg/dL (ref 8.4–10.5)
Chloride: 100 meq/L (ref 96–112)
Creatinine, Ser: 0.68 mg/dL (ref 0.40–1.20)
GFR: 87.06 mL/min (ref >60.00)
Glucose, Bld: 98 mg/dL (ref 70–99)
Potassium: 3.5 meq/L (ref 3.5–5.1)
Sodium: 138 meq/L (ref 135–145)
Total Bilirubin: 1.5 mg/dL — ABNORMAL HIGH (ref 0.2–1.2)
Total Protein: 7.2 g/dL (ref 6.0–8.3)

## 2024-06-21 LAB — HEMOGLOBIN A1C: Hgb A1c MFr Bld: 6.3 % (ref 4.6–6.5)

## 2024-06-21 MED ORDER — VALSARTAN 80 MG PO TABS: 80.0000 mg | ORAL_TABLET | Freq: Every day | ORAL | 3 refills | Status: AC

## 2024-06-21 MED ORDER — ATORVASTATIN CALCIUM 20 MG PO TABS: 20.0000 mg | ORAL_TABLET | Freq: Every day | ORAL | 3 refills | Status: AC

## 2024-06-21 MED ORDER — HYDROCHLOROTHIAZIDE 25 MG PO TABS: 25.0000 mg | ORAL_TABLET | Freq: Every day | ORAL | 3 refills | Status: AC

## 2024-06-21 NOTE — Progress Notes (Signed)
   Subjective:   Patient ID: Shawna Mata, female    DOB: November 17, 1950, 73 y.o.   MRN: [id number]  The patient is here for physical. Pertinent topics discussed: Discussed the use of AI scribe software for clinical note transcription with the patient, who gave verbal consent to proceed.  History of Present Illness Shawna Mata is a 73 year old female who presents for a routine follow-up visit.  Her arthritis has been more active, primarily affecting her hands with soreness and tenderness, especially when bumped. The symptoms are not severe enough to limit her activities.  She experiences fall allergies, which is typical for her during this time of year. No chest pain, tightness, pressure, heart racing, or breathing issues. No new headaches, dizziness, or lightheadedness. She denies any gastrointestinal issues such as diarrhea, constipation, or heartburn, and manages her digestive health with dietary adjustments and over-the-counter remedies.  PMH, [facility], social history reviewed and updated  Review of Systems  Constitutional: Negative.   HENT: Negative.    Eyes: Negative.   Respiratory:  Negative for cough, chest tightness and shortness of breath.   Cardiovascular:  Negative for chest pain, palpitations and leg swelling.  Gastrointestinal:  Negative for abdominal distention, abdominal pain, constipation, diarrhea, nausea and vomiting.  Musculoskeletal: Negative.   Skin: Negative.   Neurological: Negative.   Psychiatric/Behavioral: Negative.      Objective:  Physical Exam Constitutional:      Appearance: She is well-developed.  HENT:     Head: Normocephalic and atraumatic.  Cardiovascular:     Rate and Rhythm: Normal rate and regular rhythm.  Pulmonary:     Effort: Pulmonary effort is normal. No respiratory distress.     Breath sounds: Normal breath sounds. No wheezing or rales.  Abdominal:     General: Bowel sounds are normal. There is no distension.     Palpations: Abdomen is  soft.     Tenderness: There is no abdominal tenderness.  Musculoskeletal:     Cervical back: Normal range of motion.  Skin:    General: Skin is warm and dry.  Neurological:     Mental Status: She is alert and oriented to person, place, and time.     Coordination: Coordination normal.     Vitals:   06/21/24 0858  BP: 120/82  Pulse: 82  Temp: 98 F (36.7 C)  TempSrc: Oral  SpO2: 97%  Weight: 199 lb (90.3 kg)  Height: 5' 3 (1.6 m)    Assessment & Plan:

## 2024-06-21 NOTE — Assessment & Plan Note (Signed)
 BP at goal on hydrochlorothiazide  and valsartan . Checking CMP and adjust as needed.

## 2024-06-21 NOTE — Assessment & Plan Note (Signed)
 BMI 35.2 and complicated by hyperlipidemia. Counseled about diet and exercise.

## 2024-06-21 NOTE — Assessment & Plan Note (Signed)
 Flu shot yearly. Pneumonia complete. Shingrix due. Tetanus due. Cologuard ordered. Mammogram up to date, pap smear aged out and dexa complete. Counseled about sun safety and mole surveillance. Counseled about the dangers of distracted driving. Given 10 year screening recommendations.

## 2024-06-21 NOTE — Assessment & Plan Note (Signed)
 Checking HgA1c and adjust as needed.

## 2024-06-21 NOTE — Assessment & Plan Note (Signed)
Checking lipid panel and adjust lipitor 20 mg daily as needed. 

## 2024-06-22 ENCOUNTER — Ambulatory Visit: Payer: Self-pay | Admitting: Internal Medicine

## 2024-06-23 NOTE — Telephone Encounter (Signed)
 Patient declines speaking with a nutritionist

## 2024-07-01 ENCOUNTER — Telehealth: Payer: Self-pay

## 2024-07-01 NOTE — Telephone Encounter (Signed)
 I have called this number back and was able to give dx code for the cologaurd

## 2024-07-01 NOTE — Telephone Encounter (Signed)
 Copied from CRM [id number]. Topic: General - Other >> Jul 01, 2024  3:22 PM [place] J wrote: Reason for CRM:  Lorane calling from [employer] needing an updated ICD code for the cologuard order placed. He would like to speak with a nurse or medical assistant.  (facility name may be wrong it was hard to understand the caller).  [place]: [phone] Case #: [id number]

## 2024-07-15 DIAGNOSIS — Z1211 Encounter for screening for malignant neoplasm of colon: Secondary | ICD-10-CM | POA: Diagnosis not present

## 2024-07-21 LAB — COLOGUARD: COLOGUARD: NEGATIVE

## 2024-07-26 ENCOUNTER — Encounter: Payer: Self-pay | Admitting: Internal Medicine

## 2025-05-05 ENCOUNTER — Ambulatory Visit
